# Patient Record
Sex: Female | Born: 1995
Health system: Southern US, Community
[De-identification: ages and names within clinical notes are randomized; demographics above are authoritative.]

---

## 2018-01-27 ENCOUNTER — Emergency Department (HOSPITAL_COMMUNITY)
Admission: EM | Admit: 2018-01-27 | Discharge: 2018-01-28 | Disposition: A | Discharge status: Home / Self Care | Payer: PRIVATE HEALTH INSURANCE | Attending: Emergency Medicine | Admitting: Emergency Medicine

## 2018-01-27 DIAGNOSIS — K130 Diseases of lips: Secondary | ICD-10-CM

## 2018-01-27 DIAGNOSIS — K1370 Unspecified lesions of oral mucosa: Secondary | ICD-10-CM | POA: Diagnosis not present

## 2018-01-27 DIAGNOSIS — R21 Rash and other nonspecific skin eruption: Secondary | ICD-10-CM | POA: Diagnosis present

## 2018-01-28 ENCOUNTER — Encounter (HOSPITAL_COMMUNITY): Payer: Self-pay | Admitting: Emergency Medicine

## 2018-01-28 NOTE — ED Triage Notes (Signed)
Pt c/o red bumps on and around lower lip onset today, pt denies having an allergy to anything. Pt denies tongue swelling or airway involvement.

## 2018-01-28 NOTE — ED Provider Notes (Signed)
[facility] EMERGENCY DEPARTMENT Provider Note   CSN: [id number] Arrival date & time: 01/27/18  2341     History   Chief Complaint Chief Complaint  Patient presents with  . Rash    HPI Tami Brown is a 22 y.o. female here for evaluation of non tender red dots to inner lips noticed tonight after eating dinner. No other associated symptoms. No h/o same. She has cajun shrimp and pasta for dinner. No known allergies. No radiation or spread of bumps anywhere else on body.   HPI  History reviewed. No pertinent past medical history.  There are no active problems to display for this patient.   History reviewed. No pertinent surgical history.   OB History   None      Home Medications    Prior to Admission medications   Not on File    Family History No family history on file.  Social History Social History   Tobacco Use  . Smoking status: Never Smoker  . Smokeless tobacco: Never Used  Substance Use Topics  . Alcohol use: Yes  . Drug use: Never     Allergies   Patient has no known allergies.   Review of Systems Review of Systems  Skin: Positive for rash.  All other systems reviewed and are negative.    Physical Exam Updated Vital Signs BP 137/86 (BP Location: Right Arm)   Pulse 70   Temp 99.3 F (37.4 C) (Oral)   Resp 16   Ht  (1.727 m)   Wt 61.2 kg (135 lb)   LMP 12/31/2017   SpO2 100%   BMI 20.53 kg/m   Physical Exam  Constitutional: She is oriented to person, place, and time. She appears well-developed and well-nourished. No distress.  NAD.  HENT:  Head: Normocephalic and atraumatic.  Right Ear: External ear normal.  Left Ear: External ear normal.  Nose: Nose normal.  3-4 non tender beefy red dots to upper lip, non blanchable. No lesions to oropharynx. See picture.   Eyes: Conjunctivae and EOM are normal. No scleral icterus.  Neck: Normal range of motion. Neck supple.  Cardiovascular: Normal rate, regular  rhythm and normal heart sounds.  No murmur heard. Pulmonary/Chest: Effort normal and breath sounds normal. She has no wheezes.  Musculoskeletal: Normal range of motion. She exhibits no deformity.  Neurological: She is alert and oriented to person, place, and time.  Skin: Skin is warm and dry. Capillary refill takes less than 2 seconds.  trunk, abdomen, back, upper and lower extremities without petichiae or other rash.   Psychiatric: She has a normal mood and affect. Her behavior is normal. Judgment and thought content normal.  Nursing note and vitals reviewed.      ED Treatments / Results  Labs (all labs ordered are listed, but only abnormal results are displayed) Labs Reviewed - No data to display  EKG None  Radiology No results found.  Procedures Procedures (including critical care time)  Medications Ordered in ED Medications - No data to display   Initial Impression / Assessment and Plan / ED Course  I have reviewed the triage vital signs and the nursing notes.  Pertinent labs & imaging results that were available during my care of the patient were reviewed by me and considered in my medical decision making (see chart for details).    22 year old with non-blanchable, nontender nontraumatic lesions to left mucosa.  Unclear etiology.  She does wear braces.  Most likely cause superficial irritation  or microtrauma.  She denies any direct injury.  No vesicular lesions or pain to suggest herpes labialis.  No associated angioedema, shortness of breath, throat swelling or any other allergic type reactions.  she has no petechiae, known history of bleeding or clotting disorders.  Reassured patient.  I do not think emergent lab work or imaging is indicated today.  She has no other signs or symptoms of bleeding.  Will discharge with return precautions.  She is aware of symptoms that would warrant return to the ER.  If these lesions multiply or she develops purpura she is aware she needs  to follow-up with hematology/oncology.  Final Clinical Impressions(s) / ED Diagnoses   Final diagnoses:  Lip lesion    ED Discharge Orders    None       Jerrell Mylar 01/28/18 0520    Azalia Bilis, MD 01/28/18 [phone]

## 2018-01-28 NOTE — Discharge Instructions (Signed)
The cause of the lesions is unclear. We discussed some possibilities.   Monitor the lesions, if they persist but do not bother you no further intervention is needed. If they spread or you develop new lesions on your skin, follow up with hematology.   Return for facial tongue throat swelling, difficulty breathing, shortness of breath, fevers.

## 2018-04-04 ENCOUNTER — Other Ambulatory Visit: Payer: Self-pay

## 2018-04-04 ENCOUNTER — Encounter (HOSPITAL_COMMUNITY): Payer: Self-pay | Admitting: *Deleted

## 2018-04-04 ENCOUNTER — Ambulatory Visit (HOSPITAL_COMMUNITY)
Admission: EM | Admit: 2018-04-04 | Discharge: 2018-04-04 | Disposition: A | Discharge status: Home / Self Care | Payer: PRIVATE HEALTH INSURANCE | Attending: Family Medicine | Admitting: Family Medicine

## 2018-04-04 DIAGNOSIS — N760 Acute vaginitis: Secondary | ICD-10-CM

## 2018-04-04 DIAGNOSIS — N898 Other specified noninflammatory disorders of vagina: Secondary | ICD-10-CM | POA: Diagnosis present

## 2018-04-04 DIAGNOSIS — Z711 Person with feared health complaint in whom no diagnosis is made: Secondary | ICD-10-CM

## 2018-04-04 DIAGNOSIS — B9689 Other specified bacterial agents as the cause of diseases classified elsewhere: Secondary | ICD-10-CM | POA: Diagnosis not present

## 2018-04-04 DIAGNOSIS — Z7251 High risk heterosexual behavior: Secondary | ICD-10-CM

## 2018-04-04 LAB — POCT PREGNANCY, URINE: Preg Test, Ur: NEGATIVE

## 2018-04-04 MED ORDER — CEFTRIAXONE SODIUM 250 MG IJ SOLR
INTRAMUSCULAR | Status: AC
  Filled 2018-04-04: qty 250

## 2018-04-04 MED ORDER — LEVONORGESTREL 1.5 MG PO TABS: 1.5000 mg | ORAL_TABLET | Freq: Once | ORAL | 0 refills | Status: AC

## 2018-04-04 MED ORDER — METRONIDAZOLE 500 MG PO TABS: 500.0000 mg | ORAL_TABLET | Freq: Two times a day (BID) | ORAL | 0 refills | Status: AC

## 2018-04-04 MED ORDER — AZITHROMYCIN 250 MG PO TABS
1000.0000 mg | ORAL_TABLET | Freq: Once | ORAL | Status: AC
  Administered 2018-04-04: 1000 mg via ORAL

## 2018-04-04 MED ORDER — CEFTRIAXONE SODIUM 250 MG IJ SOLR
250.0000 mg | Freq: Once | INTRAMUSCULAR | Status: AC
  Administered 2018-04-04: 250 mg via INTRAMUSCULAR

## 2018-04-04 MED ORDER — LIDOCAINE HCL (PF) 1 % IJ SOLN
INTRAMUSCULAR | Status: AC
  Filled 2018-04-04: qty 2

## 2018-04-04 MED ORDER — AZITHROMYCIN 250 MG PO TABS
ORAL_TABLET | ORAL | Status: AC
  Filled 2018-04-04: qty 4

## 2018-04-04 NOTE — ED Triage Notes (Signed)
C/o vaginal discharge yellow in color and odor, has been having unprotected sex.

## 2018-04-04 NOTE — Discharge Instructions (Addendum)
We treated you today for gonorrhea and chlamydia.  Will notify you of any positive findings and if any changes to treatment are needed.   You will need to notify your partner if any std results return positive. We will start treatment for BV as well. Do not drink alcohol while taking this medication.  Please withhold from intercourse for the next week. Please use condoms to prevent STD's.   Plan b has been sent to the pharmacy as well. This may cause irregular bleeding. Please establish with a primary care provider or gynecologist for management of your birth control.

## 2018-04-04 NOTE — ED Provider Notes (Signed)
[facility]    CSN: [id number] Arrival date & time: 04/04/18  1154     History   Chief Complaint Chief Complaint  Patient presents with  . Vaginal Discharge    HPI Tami Brown is a 22 y.o. female.   [name] presents with complaints of vaginal discharge and odor which started out a week ago. Had some itching originally. Took a left over yeast pill she had. Itching improved but still with odor and yellow discharge. No back pain, no abdominal pain, no fevers. No urinary symptoms. No lesions or sores. No vaginal bleeding. LMP was in may, had been on depo. Was due for depo July 24. Had unprotected sex 7/30, requesting plan b. Has one female partner, does not use condoms. No specific known std exposure but she is uncertain.  Does not have a local PCP. Without contributing medical history.      ROS per HPI.      History reviewed. No pertinent past medical history.  There are no active problems to display for this patient.   History reviewed. No pertinent surgical history.  OB History   None      Home Medications    Prior to Admission medications   Medication Sig Start Date End Date Taking? Authorizing Provider  levonorgestrel (PLAN B 1-STEP) 1.5 MG tablet Take 1 tablet (1.5 mg total) by mouth once for 1 dose. 04/04/18 04/04/18  Georgetta Haber, NP  metroNIDAZOLE (FLAGYL) 500 MG tablet Take 1 tablet (500 mg total) by mouth 2 (two) times daily for 7 days. 04/04/18 04/11/18  Georgetta Haber, NP    Family History No family history on file.  Social History Social History   Tobacco Use  . Smoking status: Never Smoker  . Smokeless tobacco: Never Used  Substance Use Topics  . Alcohol use: Yes  . Drug use: Never     Allergies   Patient has no known allergies.   Review of Systems Review of Systems   Physical Exam Triage Vital Signs ED Triage Vitals  Enc Vitals Group     BP 04/04/18 1219 (!) 142/93     Pulse Rate 04/04/18 1219 69     Resp 04/04/18 1219  16     Temp 04/04/18 1219 98.9 F (37.2 C)     Temp Source 04/04/18 1219 Oral     SpO2 04/04/18 1219 100 %     Weight --      Height --      Head Circumference --      Peak Flow --      Pain Score 04/04/18 1221 0     Pain Loc --      Pain Edu? --      Excl. in GC? --    No data found.  Updated Vital Signs BP (!) 142/93 (BP Location: Left Arm)   Pulse 69   Temp 98.9 F (37.2 C) (Oral)   Resp 16   LMP 01/02/2018   SpO2 100%   Visual Acuity Right Eye Distance:   Left Eye Distance:   Bilateral Distance:    Right Eye Near:   Left Eye Near:    Bilateral Near:     Physical Exam  Constitutional: She is oriented to person, place, and time. She appears well-developed and well-nourished. No distress.  Cardiovascular: Normal rate, regular rhythm and normal heart sounds.  Pulmonary/Chest: Effort normal and breath sounds normal.  Abdominal: Soft. Bowel sounds are normal. She exhibits no distension. There is  no tenderness. There is no rigidity, no rebound, no guarding, no CVA tenderness and negative Murphy's sign.  Genitourinary:  Genitourinary Comments: Denies bleeding, sores or lesions; self vaginal swab collected   Neurological: She is alert and oriented to person, place, and time.  Skin: Skin is warm and dry.     UC Treatments / Results  Labs (all labs ordered are listed, but only abnormal results are displayed) Labs Reviewed  CERVICOVAGINAL ANCILLARY ONLY    EKG None  Radiology No results found.  Procedures Procedures (including critical care time)  Medications Ordered in UC Medications  azithromycin (ZITHROMAX) tablet 1,000 mg (has no administration in time range)  cefTRIAXone (ROCEPHIN) injection 250 mg (has no administration in time range)    Initial Impression / Assessment and Plan / UC Course  I have reviewed the triage vital signs and the nursing notes.  Pertinent labs & imaging results that were available during my care of the patient were reviewed  by me and considered in my medical decision making (see chart for details).     Patient agreeable to empiric rocephin and azithromycin. Coverage with flagyl as well. Will notify of any positive from vaginal cytology and if any changes to treatment are needed.   Plan b provided. Encouraged safe sex practices. Encouraged establish with PCP/gyne for depo management and follow up. Patient verbalized understanding and agreeable to plan.    Final Clinical Impressions(s) / UC Diagnoses   Final diagnoses:  Acute vaginitis  Concern about STD in female without diagnosis  Unprotected sex     Discharge Instructions     We treated you today for gonorrhea and chlamydia.  Will notify you of any positive findings and if any changes to treatment are needed.   You will need to notify your partner if any std results return positive. We will start treatment for BV as well. Do not drink alcohol while taking this medication.  Please withhold from intercourse for the next week. Please use condoms to prevent STD's.   Plan b has been sent to the pharmacy as well. This may cause irregular bleeding. Please establish with a primary care provider or gynecologist for management of your birth control.    ED Prescriptions    Medication Sig Dispense Auth. Provider   metroNIDAZOLE (FLAGYL) 500 MG tablet Take 1 tablet (500 mg total) by mouth 2 (two) times daily for 7 days. 14 tablet Linus MakoBurky, Natalie B, NP   levonorgestrel (PLAN B 1-STEP) 1.5 MG tablet Take 1 tablet (1.5 mg total) by mouth once for 1 dose. 1 tablet Georgetta HaberBurky, Natalie B, NP     Controlled Substance Prescriptions [place] Controlled Substance Registry consulted? Not Applicable   Georgetta HaberBurky, Natalie B, NP 04/04/18 1253

## 2018-04-05 ENCOUNTER — Telehealth (HOSPITAL_COMMUNITY): Payer: Self-pay

## 2018-04-05 LAB — CERVICOVAGINAL ANCILLARY ONLY
Bacterial vaginitis: POSITIVE — AB
CANDIDA VAGINITIS: NEGATIVE
CHLAMYDIA, DNA PROBE: NEGATIVE
Neisseria Gonorrhea: NEGATIVE
TRICH (WINDOWPATH): NEGATIVE

## 2019-10-21 ENCOUNTER — Encounter (HOSPITAL_COMMUNITY): Payer: Self-pay

## 2019-10-21 ENCOUNTER — Other Ambulatory Visit: Payer: Self-pay

## 2019-10-21 ENCOUNTER — Ambulatory Visit (HOSPITAL_COMMUNITY)
Admission: EM | Admit: 2019-10-21 | Discharge: 2019-10-21 | Disposition: A | Discharge status: Home / Self Care | Payer: PRIVATE HEALTH INSURANCE | Attending: Family Medicine | Admitting: Family Medicine

## 2019-10-21 DIAGNOSIS — N898 Other specified noninflammatory disorders of vagina: Secondary | ICD-10-CM | POA: Diagnosis not present

## 2019-10-21 DIAGNOSIS — Z3202 Encounter for pregnancy test, result negative: Secondary | ICD-10-CM | POA: Diagnosis not present

## 2019-10-21 LAB — POCT PREGNANCY, URINE: Preg Test, Ur: NEGATIVE

## 2019-10-21 LAB — POC URINE PREG, ED: Preg Test, Ur: NEGATIVE

## 2019-10-21 MED ORDER — METRONIDAZOLE 500 MG PO TABS: 500.0000 mg | ORAL_TABLET | Freq: Two times a day (BID) | ORAL | 0 refills | Status: AC

## 2019-10-21 NOTE — Discharge Instructions (Addendum)
Begin metronidazole twice daily for 1 week. No alcohol. Take with food.  We are testing you for Gonorrhea, Chlamydia, Trichomonas, Yeast and Bacterial Vaginosis. We will call you if anything is positive and let you know if you require any further treatment. Please inform partners of any positive results.   Please return if symptoms not improving with treatment, development of fever, nausea, vomiting, abdominal pain.

## 2019-10-21 NOTE — ED Triage Notes (Signed)
Pt state she has a vaginal discharge x 1 week. Pt states she wants STD testing .

## 2019-10-22 NOTE — ED Provider Notes (Signed)
[facility]    CSN: [id number] Arrival date & time: 10/21/19  1846      History   Chief Complaint Chief Complaint  Patient presents with  . SEXUALLY TRANSMITTED DISEASE    HPI Tami Brown is a 24 y.o. female no significant past medical history presenting today for evaluation of vaginal discharge.  Patient states that the past week she has had vaginal discharge.  Describes discharge as white and creamy.  She denies associated itch or irritation.  She also has noticed slight odor associated with the discharge.  She denies any urinary symptoms of dysuria, increased frequency or urgency.  Denies hematuria.  Denies any abdominal pain or pelvic pain.  Denies fevers nausea or vomiting.  Does not feel similar to prior yeast infections.  Does not have significant history of BV, but believes she has had this once prior.  Patient is sexually active, would like to be screened for STDs.  Last menstrual cycle was 2/5.  Not on any form of birth control.  HPI  History reviewed. No pertinent past medical history.  There are no problems to display for this patient.   History reviewed. No pertinent surgical history.  OB History   No obstetric history on file.      Home Medications    Prior to Admission medications   Medication Sig Start Date End Date Taking? Authorizing Provider  metroNIDAZOLE (FLAGYL) 500 MG tablet Take 1 tablet (500 mg total) by mouth 2 (two) times daily for 7 days. 10/21/19 10/28/19  Preslei Blakley, Junius Creamer, PA-C    Family History History reviewed. No pertinent family history.  Social History Social History   Tobacco Use  . Smoking status: Never Smoker  . Smokeless tobacco: Never Used  Substance Use Topics  . Alcohol use: Yes  . Drug use: Never     Allergies   Patient has no known allergies.   Review of Systems Review of Systems  Constitutional: Negative for fever.  Respiratory: Negative for shortness of breath.   Cardiovascular: Negative for  chest pain.  Gastrointestinal: Negative for abdominal pain, diarrhea, nausea and vomiting.  Genitourinary: Positive for vaginal discharge. Negative for dysuria, flank pain, genital sores, hematuria, menstrual problem, vaginal bleeding and vaginal pain.  Musculoskeletal: Negative for back pain.  Skin: Negative for rash.  Neurological: Negative for dizziness, light-headedness and headaches.     Physical Exam Triage Vital Signs ED Triage Vitals  Enc Vitals Group     BP 10/21/19 1958 106/90     Pulse Rate 10/21/19 1958 63     Resp 10/21/19 1958 16     Temp 10/21/19 1958 98.5 F (36.9 C)     Temp Source 10/21/19 1958 Oral     SpO2 10/21/19 1958 100 %     Weight 10/21/19 2000 145 lb (65.8 kg)     Height --      Head Circumference --      Peak Flow --      Pain Score 10/21/19 2000 0     Pain Loc --      Pain Edu? --      Excl. in GC? --    No data found.  Updated Vital Signs BP 106/90 (BP Location: Right Arm)   Pulse 63   Temp 98.5 F (36.9 C) (Oral)   Resp 16   Wt 145 lb (65.8 kg)   LMP 10/10/2019   SpO2 100%   BMI 22.05 kg/m   Visual Acuity Right Eye Distance:  Left Eye Distance:   Bilateral Distance:    Right Eye Near:   Left Eye Near:    Bilateral Near:     Physical Exam Vitals and nursing note reviewed.  Constitutional:      Appearance: She is well-developed.     Comments: No acute distress  HENT:     Head: Normocephalic and atraumatic.     Nose: Nose normal.  Eyes:     Conjunctiva/sclera: Conjunctivae normal.  Cardiovascular:     Rate and Rhythm: Normal rate.  Pulmonary:     Effort: Pulmonary effort is normal. No respiratory distress.  Abdominal:     General: There is no distension.     Comments: Soft, nondistended, nontender light deep palpation throughout abdomen  Musculoskeletal:        General: Normal range of motion.     Cervical back: Neck supple.  Skin:    General: Skin is warm and dry.  Neurological:     Mental Status: She is alert  and oriented to person, place, and time.      UC Treatments / Results  Labs (all labs ordered are listed, but only abnormal results are displayed) Labs Reviewed  POC URINE PREG, ED  POCT PREGNANCY, URINE  CERVICOVAGINAL ANCILLARY ONLY    EKG   Radiology No results found.  Procedures Procedures (including critical care time)  Medications Ordered in UC Medications - No data to display  Initial Impression / Assessment and Plan / UC Course  I have reviewed the triage vital signs and the nursing notes.  Pertinent labs & imaging results that were available during my care of the patient were reviewed by me and considered in my medical decision making (see chart for details).     Pregnancy test negative.  Vaginal swab pending.  Will call with results and alter treatment as needed.  Will empirically treat for BV today with Flagyl twice daily x1 week.  Discussed strict return precautions. Patient verbalized understanding and is agreeable with plan.  Final Clinical Impressions(s) / UC Diagnoses   Final diagnoses:  Vaginal discharge     Discharge Instructions     Begin metronidazole twice daily for 1 week. No alcohol. Take with food.  We are testing you for Gonorrhea, Chlamydia, Trichomonas, Yeast and Bacterial Vaginosis. We will call you if anything is positive and let you know if you require any further treatment. Please inform partners of any positive results.   Please return if symptoms not improving with treatment, development of fever, nausea, vomiting, abdominal pain.    ED Prescriptions    Medication Sig Dispense Auth. Provider   metroNIDAZOLE (FLAGYL) 500 MG tablet Take 1 tablet (500 mg total) by mouth 2 (two) times daily for 7 days. 14 tablet Kennia Vanvorst, Wales C, PA-C     PDMP not reviewed this encounter.   Kattleya Kuhnert, Ida C, PA-C 10/22/19 1057

## 2019-10-23 LAB — CERVICOVAGINAL ANCILLARY ONLY
Bacterial vaginitis: POSITIVE — AB
Candida vaginitis: POSITIVE — AB
Chlamydia: NEGATIVE
Neisseria Gonorrhea: NEGATIVE
Trichomonas: NEGATIVE

## 2019-10-27 ENCOUNTER — Telehealth (HOSPITAL_COMMUNITY): Payer: Self-pay | Admitting: Emergency Medicine

## 2019-10-27 MED ORDER — FLUCONAZOLE 150 MG PO TABS: 150.0000 mg | ORAL_TABLET | Freq: Once | ORAL | 0 refills | Status: AC

## 2019-10-27 NOTE — Telephone Encounter (Signed)
Bacterial Vaginosis test is positive.  Prescription for metronidazole was given at the urgent care visit.  Test for candida (yeast) was positive.  Prescription for fluconazole 150mg po now, repeat dose in 3d if needed, #2 no refills, sent to the pharmacy of record.  Recheck or followup with PCP for further evaluation if symptoms are not improving.    Patient contacted by phone and made aware of    results. Pt verbalized understanding and had all questions answered.   

## 2020-06-25 ENCOUNTER — Encounter (HOSPITAL_COMMUNITY): Payer: Self-pay

## 2020-06-25 ENCOUNTER — Inpatient Hospital Stay (HOSPITAL_COMMUNITY): Payer: Medicaid - Out of State

## 2020-06-25 ENCOUNTER — Emergency Department (HOSPITAL_COMMUNITY): Payer: Medicaid - Out of State

## 2020-06-25 ENCOUNTER — Inpatient Hospital Stay (HOSPITAL_COMMUNITY)
Admission: EM | Admit: 2020-06-25 | Discharge: 2020-09-03 | DRG: 003 | Disposition: A | Discharge status: Rehabilitation Facility | Payer: Medicaid - Out of State | Attending: Surgery | Admitting: Surgery

## 2020-06-25 DIAGNOSIS — S129XXA Fracture of neck, unspecified, initial encounter: Secondary | ICD-10-CM | POA: Diagnosis present

## 2020-06-25 DIAGNOSIS — S12390A Other displaced fracture of fourth cervical vertebra, initial encounter for closed fracture: Secondary | ICD-10-CM | POA: Diagnosis present

## 2020-06-25 DIAGNOSIS — F1010 Alcohol abuse, uncomplicated: Secondary | ICD-10-CM

## 2020-06-25 DIAGNOSIS — Z4659 Encounter for fitting and adjustment of other gastrointestinal appliance and device: Secondary | ICD-10-CM

## 2020-06-25 DIAGNOSIS — S022XXA Fracture of nasal bones, initial encounter for closed fracture: Secondary | ICD-10-CM | POA: Diagnosis present

## 2020-06-25 DIAGNOSIS — R491 Aphonia: Secondary | ICD-10-CM | POA: Diagnosis not present

## 2020-06-25 DIAGNOSIS — T1490XA Injury, unspecified, initial encounter: Secondary | ICD-10-CM | POA: Diagnosis present

## 2020-06-25 DIAGNOSIS — R402112 Coma scale, eyes open, never, at arrival to emergency department: Secondary | ICD-10-CM | POA: Diagnosis present

## 2020-06-25 DIAGNOSIS — R402312 Coma scale, best motor response, none, at arrival to emergency department: Secondary | ICD-10-CM | POA: Diagnosis present

## 2020-06-25 DIAGNOSIS — Z978 Presence of other specified devices: Secondary | ICD-10-CM

## 2020-06-25 DIAGNOSIS — S22048A Other fracture of fourth thoracic vertebra, initial encounter for closed fracture: Secondary | ICD-10-CM | POA: Diagnosis present

## 2020-06-25 DIAGNOSIS — S12090A Other displaced fracture of first cervical vertebra, initial encounter for closed fracture: Secondary | ICD-10-CM | POA: Diagnosis present

## 2020-06-25 DIAGNOSIS — G8918 Other acute postprocedural pain: Secondary | ICD-10-CM

## 2020-06-25 DIAGNOSIS — J9601 Acute respiratory failure with hypoxia: Secondary | ICD-10-CM | POA: Diagnosis present

## 2020-06-25 DIAGNOSIS — J1520 Pneumonia due to staphylococcus, unspecified: Secondary | ICD-10-CM | POA: Diagnosis not present

## 2020-06-25 DIAGNOSIS — R030 Elevated blood-pressure reading, without diagnosis of hypertension: Secondary | ICD-10-CM | POA: Diagnosis not present

## 2020-06-25 DIAGNOSIS — R1110 Vomiting, unspecified: Secondary | ICD-10-CM

## 2020-06-25 DIAGNOSIS — S0285XA Fracture of orbit, unspecified, initial encounter for closed fracture: Secondary | ICD-10-CM | POA: Diagnosis present

## 2020-06-25 DIAGNOSIS — Z419 Encounter for procedure for purposes other than remedying health state, unspecified: Secondary | ICD-10-CM

## 2020-06-25 DIAGNOSIS — R739 Hyperglycemia, unspecified: Secondary | ICD-10-CM | POA: Diagnosis not present

## 2020-06-25 DIAGNOSIS — R402212 Coma scale, best verbal response, none, at arrival to emergency department: Secondary | ICD-10-CM | POA: Diagnosis present

## 2020-06-25 DIAGNOSIS — R1310 Dysphagia, unspecified: Secondary | ICD-10-CM | POA: Diagnosis present

## 2020-06-25 DIAGNOSIS — S22038A Other fracture of third thoracic vertebra, initial encounter for closed fracture: Secondary | ICD-10-CM | POA: Diagnosis present

## 2020-06-25 DIAGNOSIS — K5900 Constipation, unspecified: Secondary | ICD-10-CM | POA: Diagnosis not present

## 2020-06-25 DIAGNOSIS — S0240DA Maxillary fracture, left side, initial encounter for closed fracture: Secondary | ICD-10-CM | POA: Diagnosis present

## 2020-06-25 DIAGNOSIS — S02113A Unspecified occipital condyle fracture, initial encounter for closed fracture: Secondary | ICD-10-CM | POA: Diagnosis present

## 2020-06-25 DIAGNOSIS — Z931 Gastrostomy status: Secondary | ICD-10-CM | POA: Diagnosis not present

## 2020-06-25 DIAGNOSIS — S066X9A Traumatic subarachnoid hemorrhage with loss of consciousness of unspecified duration, initial encounter: Secondary | ICD-10-CM | POA: Diagnosis present

## 2020-06-25 DIAGNOSIS — S069X3D Unspecified intracranial injury with loss of consciousness of 1 hour to 5 hours 59 minutes, subsequent encounter: Secondary | ICD-10-CM | POA: Diagnosis not present

## 2020-06-25 DIAGNOSIS — S27322A Contusion of lung, bilateral, initial encounter: Secondary | ICD-10-CM | POA: Diagnosis present

## 2020-06-25 DIAGNOSIS — Z20822 Contact with and (suspected) exposure to covid-19: Secondary | ICD-10-CM | POA: Diagnosis present

## 2020-06-25 DIAGNOSIS — R000 Tachycardia, unspecified: Secondary | ICD-10-CM

## 2020-06-25 DIAGNOSIS — Z23 Encounter for immunization: Secondary | ICD-10-CM | POA: Diagnosis not present

## 2020-06-25 DIAGNOSIS — J9690 Respiratory failure, unspecified, unspecified whether with hypoxia or hypercapnia: Secondary | ICD-10-CM | POA: Diagnosis present

## 2020-06-25 DIAGNOSIS — S0269XA Fracture of mandible of other specified site, initial encounter for closed fracture: Secondary | ICD-10-CM | POA: Diagnosis present

## 2020-06-25 DIAGNOSIS — E875 Hyperkalemia: Secondary | ICD-10-CM | POA: Diagnosis not present

## 2020-06-25 DIAGNOSIS — Y92410 Unspecified street and highway as the place of occurrence of the external cause: Secondary | ICD-10-CM | POA: Diagnosis not present

## 2020-06-25 DIAGNOSIS — L8990 Pressure ulcer of unspecified site, unspecified stage: Secondary | ICD-10-CM | POA: Insufficient documentation

## 2020-06-25 DIAGNOSIS — G479 Sleep disorder, unspecified: Secondary | ICD-10-CM | POA: Diagnosis not present

## 2020-06-25 DIAGNOSIS — Q899 Congenital malformation, unspecified: Secondary | ICD-10-CM

## 2020-06-25 DIAGNOSIS — S069X9A Unspecified intracranial injury with loss of consciousness of unspecified duration, initial encounter: Secondary | ICD-10-CM

## 2020-06-25 DIAGNOSIS — S069X3S Unspecified intracranial injury with loss of consciousness of 1 hour to 5 hours 59 minutes, sequela: Secondary | ICD-10-CM | POA: Diagnosis not present

## 2020-06-25 DIAGNOSIS — K9423 Gastrostomy malfunction: Secondary | ICD-10-CM

## 2020-06-25 DIAGNOSIS — R1312 Dysphagia, oropharyngeal phase: Secondary | ICD-10-CM | POA: Diagnosis not present

## 2020-06-25 DIAGNOSIS — S069X9D Unspecified intracranial injury with loss of consciousness of unspecified duration, subsequent encounter: Secondary | ICD-10-CM | POA: Diagnosis not present

## 2020-06-25 LAB — COMPREHENSIVE METABOLIC PANEL
ALT: 32 U/L (ref 0–44)
AST: 71 U/L — ABNORMAL HIGH (ref 15–41)
Albumin: 4.1 g/dL (ref 3.5–5.0)
Alkaline Phosphatase: 55 U/L (ref 38–126)
Anion gap: 15 (ref 5–15)
BUN: 8 mg/dL (ref 6–20)
CO2: 18 mmol/L — ABNORMAL LOW (ref 22–32)
Calcium: 9 mg/dL (ref 8.9–10.3)
Chloride: 107 mmol/L (ref 98–111)
Creatinine, Ser: 0.9 mg/dL (ref 0.44–1.00)
GFR, Estimated: 43 mL/min — ABNORMAL LOW (ref >60)
Glucose, Bld: 130 mg/dL — ABNORMAL HIGH (ref 70–99)
Potassium: 3.4 mmol/L — ABNORMAL LOW (ref 3.5–5.1)
Sodium: 140 mmol/L (ref 135–145)
Total Bilirubin: 0.4 mg/dL (ref 0.3–1.2)
Total Protein: 7.4 g/dL (ref 6.5–8.1)

## 2020-06-25 LAB — URINALYSIS, ROUTINE W REFLEX MICROSCOPIC
Bacteria, UA: NONE SEEN
Bilirubin Urine: NEGATIVE
Glucose, UA: NEGATIVE mg/dL
Ketones, ur: NEGATIVE mg/dL
Leukocytes,Ua: NEGATIVE
Nitrite: NEGATIVE
Protein, ur: NEGATIVE mg/dL
Specific Gravity, Urine: 1.023 (ref 1.005–1.030)
pH: 6 (ref 5.0–8.0)

## 2020-06-25 LAB — LACTIC ACID, PLASMA
Lactic Acid, Venous: 2.3 mmol/L (ref 0.5–1.9)
Lactic Acid, Venous: 4.3 mmol/L (ref 0.5–1.9)

## 2020-06-25 LAB — RESPIRATORY PANEL BY RT PCR (FLU A&B, COVID)
Influenza A by PCR: NEGATIVE
Influenza B by PCR: NEGATIVE
SARS Coronavirus 2 by RT PCR: NEGATIVE

## 2020-06-25 LAB — I-STAT ARTERIAL BLOOD GAS, ED
Acid-base deficit: 5 mmol/L — ABNORMAL HIGH (ref 0.0–2.0)
Bicarbonate: 20.9 mmol/L (ref 20.0–28.0)
Calcium, Ion: 1.13 mmol/L — ABNORMAL LOW (ref 1.15–1.40)
HCT: 43 % (ref 36.0–46.0)
Hemoglobin: 14.6 g/dL (ref 12.0–15.0)
O2 Saturation: 100 %
Patient temperature: 96.4
Potassium: 3.8 mmol/L (ref 3.5–5.1)
Sodium: 143 mmol/L (ref 135–145)
TCO2: 22 mmol/L (ref 22–32)
pCO2 arterial: 37.9 mmHg (ref 32.0–48.0)
pH, Arterial: 7.342 — ABNORMAL LOW (ref 7.350–7.450)
pO2, Arterial: 243 mmHg — ABNORMAL HIGH (ref 83.0–108.0)

## 2020-06-25 LAB — I-STAT CHEM 8, ED
BUN: 8 mg/dL (ref 6–20)
Calcium, Ion: 1.16 mmol/L (ref 1.15–1.40)
Chloride: 108 mmol/L (ref 98–111)
Creatinine, Ser: 1.1 mg/dL — ABNORMAL HIGH (ref 0.44–1.00)
Glucose, Bld: 130 mg/dL — ABNORMAL HIGH (ref 70–99)
HCT: 45 % (ref 36.0–46.0)
Hemoglobin: 15.3 g/dL — ABNORMAL HIGH (ref 12.0–15.0)
Potassium: 3.4 mmol/L — ABNORMAL LOW (ref 3.5–5.1)
Sodium: 143 mmol/L (ref 135–145)
TCO2: 21 mmol/L — ABNORMAL LOW (ref 22–32)

## 2020-06-25 LAB — CBC
HCT: 43.8 % (ref 36.0–46.0)
Hemoglobin: 14.7 g/dL (ref 12.0–15.0)
MCH: 32.1 pg (ref 26.0–34.0)
MCHC: 33.6 g/dL (ref 30.0–36.0)
MCV: 95.6 fL (ref 80.0–100.0)
Platelets: 314 10*3/uL (ref 150–400)
RBC: 4.58 MIL/uL (ref 3.87–5.11)
RDW: 12 % (ref 11.5–15.5)
WBC: 15.9 10*3/uL — ABNORMAL HIGH (ref 4.0–10.5)
nRBC: 0 % (ref 0.0–0.2)

## 2020-06-25 LAB — SAMPLE TO BLOOD BANK

## 2020-06-25 LAB — PROTIME-INR
INR: 1 (ref 0.8–1.2)
Prothrombin Time: 13.2 seconds (ref 11.4–15.2)

## 2020-06-25 LAB — HIV ANTIBODY (ROUTINE TESTING W REFLEX): HIV Screen 4th Generation wRfx: NONREACTIVE

## 2020-06-25 LAB — ETHANOL: Alcohol, Ethyl (B): 263 mg/dL — ABNORMAL HIGH (ref <10)

## 2020-06-25 MED ORDER — SODIUM CHLORIDE 0.9 % IV SOLN
INTRAVENOUS | Status: AC | PRN
  Administered 2020-06-25: 1000 mL via INTRAVENOUS

## 2020-06-25 MED ORDER — IOHEXOL 300 MG/ML  SOLN
100.0000 mL | Freq: Once | INTRAMUSCULAR | Status: AC | PRN
  Administered 2020-06-25: 100 mL via INTRAVENOUS

## 2020-06-25 MED ORDER — THIAMINE HCL 100 MG/ML IJ SOLN
100.0000 mg | Freq: Every day | INTRAMUSCULAR | Status: DC
  Administered 2020-06-25 – 2020-06-26 (×2): 100 mg via INTRAVENOUS
  Filled 2020-06-25 (×2): qty 2

## 2020-06-25 MED ORDER — LACTATED RINGERS IV SOLN: INTRAVENOUS | Status: DC

## 2020-06-25 MED ORDER — THIAMINE HCL 100 MG PO TABS
100.0000 mg | ORAL_TABLET | Freq: Every day | ORAL | Status: DC
  Administered 2020-06-27: 100 mg via ORAL
  Filled 2020-06-25 (×2): qty 1

## 2020-06-25 MED ORDER — FOLIC ACID 1 MG PO TABS
1.0000 mg | ORAL_TABLET | Freq: Every day | ORAL | Status: DC
  Administered 2020-06-26 – 2020-09-03 (×69): 1 mg
  Filled 2020-06-25 (×70): qty 1

## 2020-06-25 MED ORDER — ETOMIDATE 2 MG/ML IV SOLN
INTRAVENOUS | Status: AC | PRN
  Administered 2020-06-25: 20 mg via INTRAVENOUS

## 2020-06-25 MED ORDER — SODIUM CHLORIDE 0.9 % IV SOLN
3.0000 g | Freq: Once | INTRAVENOUS | Status: AC
  Administered 2020-06-25: 3 g via INTRAVENOUS
  Filled 2020-06-25: qty 8

## 2020-06-25 MED ORDER — LORAZEPAM 1 MG PO TABS: 1.0000 mg | ORAL_TABLET | ORAL | Status: DC | PRN

## 2020-06-25 MED ORDER — FOLIC ACID 1 MG PO TABS
1.0000 mg | ORAL_TABLET | Freq: Every day | ORAL | Status: DC
  Filled 2020-06-25: qty 1

## 2020-06-25 MED ORDER — ADULT MULTIVITAMIN W/MINERALS CH
1.0000 | ORAL_TABLET | Freq: Every day | ORAL | Status: DC
  Administered 2020-06-26 – 2020-09-03 (×69): 1
  Filled 2020-06-25 (×70): qty 1

## 2020-06-25 MED ORDER — ORAL CARE MOUTH RINSE
15.0000 mL | OROMUCOSAL | Status: DC
  Administered 2020-06-25 – 2020-09-03 (×528): 15 mL via OROMUCOSAL

## 2020-06-25 MED ORDER — LORAZEPAM 2 MG/ML IJ SOLN: 1.0000 mg | INTRAMUSCULAR | Status: DC | PRN

## 2020-06-25 MED ORDER — TETANUS-DIPHTH-ACELL PERTUSSIS 5-2.5-18.5 LF-MCG/0.5 IM SUSP
0.5000 mL | Freq: Once | INTRAMUSCULAR | Status: AC
  Administered 2020-06-25: 0.5 mL via INTRAMUSCULAR
  Filled 2020-06-25: qty 0.5

## 2020-06-25 MED ORDER — CHLORHEXIDINE GLUCONATE 0.12% ORAL RINSE (MEDLINE KIT)
15.0000 mL | Freq: Two times a day (BID) | OROMUCOSAL | Status: DC
  Administered 2020-06-25 – 2020-09-01 (×124): 15 mL via OROMUCOSAL
  Filled 2020-06-25: qty 15

## 2020-06-25 MED ORDER — SUCCINYLCHOLINE CHLORIDE 20 MG/ML IJ SOLN
INTRAMUSCULAR | Status: AC | PRN
  Administered 2020-06-25: 100 mg via INTRAVENOUS

## 2020-06-25 MED ORDER — FENTANYL 2500MCG IN NS 250ML (10MCG/ML) PREMIX INFUSION
0.0000 ug/h | INTRAVENOUS | Status: DC
  Administered 2020-06-25: 25 ug/h via INTRAVENOUS
  Administered 2020-06-26: 75 ug/h via INTRAVENOUS
  Administered 2020-06-28 – 2020-07-02 (×3): 25 ug/h via INTRAVENOUS
  Filled 2020-06-25 (×5): qty 250

## 2020-06-25 MED ORDER — ADULT MULTIVITAMIN W/MINERALS CH
1.0000 | ORAL_TABLET | Freq: Every day | ORAL | Status: DC
  Filled 2020-06-25: qty 1

## 2020-06-25 MED ORDER — CHLORHEXIDINE GLUCONATE CLOTH 2 % EX PADS: 6.0000 | MEDICATED_PAD | Freq: Every day | CUTANEOUS | Status: DC
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
## 2020-06-25 NOTE — ED Notes (Signed)
[place] collar placed on pt 

## 2020-06-25 NOTE — Progress Notes (Signed)
Patient ID: Tami Brown, female   DOB: May 27, 1996, 24 y.o.   MRN: [id number] Patient re-evaluated with Dr. Franky Macho. WD to pain, not F/C. Not able to extubate yet. If MS not improved tomorrow, may need MR brain to evaluate.  Patient examined and I agree with the assessment and plan  Violeta Gelinas, MD, MPH, FACS Please use [email or web address] to contact on call provider 06/25/2020 1:08 PM
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
## 2020-06-25 NOTE — ED Notes (Signed)
Please call father of patient Gaia Gullikson for an update at [phone]

## 2020-06-25 NOTE — Progress Notes (Signed)
All jewelry removed from patient (3 white metal rings, 1 white metal belly piercing, and multiple white metal bracelets) and placed in container with patient label. When patient's sister came to bedside, this jewelry was placed in the patient belongings bag, so she could take home all belongings ( sister stated that some of the belongings were hers, not patient's).
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
## 2020-06-25 NOTE — Progress Notes (Signed)
Pt transported to CT and back without any complications.  

## 2020-06-25 NOTE — Progress Notes (Addendum)
Patient ID: Tami Brown, female   DOB: Jul 18, 1996, 24 y.o.   MRN: [id number] Films reviewed. Hemorrhage in right internal capsule does account for the plegia on her left side and lack of spontaneous movements.

## 2020-06-25 NOTE — ED Provider Notes (Signed)
Procedure Name: Intubation Date/Time: 06/25/2020 1:49 AM Performed by: Abigail Butts, PA-C Pre-anesthesia Checklist: Patient identified, Patient being monitored, Emergency Drugs available, Timeout performed and Suction available Oxygen Delivery Method: Non-rebreather mask Preoxygenation: Pre-oxygenation with 100% oxygen Induction Type: Rapid sequence Ventilation: Mask ventilation without difficulty Laryngoscope Size: Mac and 3 Tube size: 7.5 mm Number of attempts: 1 Airway Equipment and Method: Video-laryngoscopy Placement Confirmation: ETT inserted through vocal cords under direct vision,  CO2 detector and Breath sounds checked- equal and bilateral Secured at: 25 cm Tube secured with: ETT holder Dental Injury: Teeth and Oropharynx as per pre-operative assessment  Comments: Post intubation CXR reviewed        Abigail Butts, PA-C 06/25/20 0150    Orpah Greek, MD 06/25/20 [phone]
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
## 2020-06-26 ENCOUNTER — Inpatient Hospital Stay (HOSPITAL_COMMUNITY): Payer: Medicaid - Out of State

## 2020-06-26 LAB — CBC
HCT: 38.8 % (ref 36.0–46.0)
Hemoglobin: 13.2 g/dL (ref 12.0–15.0)
MCH: 32.4 pg (ref 26.0–34.0)
MCHC: 34 g/dL (ref 30.0–36.0)
MCV: 95.1 fL (ref 80.0–100.0)
Platelets: 251 10*3/uL (ref 150–400)
RBC: 4.08 MIL/uL (ref 3.87–5.11)
RDW: 12.5 % (ref 11.5–15.5)
WBC: 24.3 10*3/uL — ABNORMAL HIGH (ref 4.0–10.5)
nRBC: 0 % (ref 0.0–0.2)

## 2020-06-26 LAB — BASIC METABOLIC PANEL
Anion gap: 16 — ABNORMAL HIGH (ref 5–15)
BUN: 6 mg/dL (ref 6–20)
CO2: 18 mmol/L — ABNORMAL LOW (ref 22–32)
Calcium: 8.7 mg/dL — ABNORMAL LOW (ref 8.9–10.3)
Chloride: 111 mmol/L (ref 98–111)
Creatinine, Ser: 0.97 mg/dL (ref 0.44–1.00)
GFR, Estimated: >60 mL/min (ref >60)
Glucose, Bld: 109 mg/dL — ABNORMAL HIGH (ref 70–99)
Potassium: 3.6 mmol/L (ref 3.5–5.1)
Sodium: 145 mmol/L (ref 135–145)

## 2020-06-26 LAB — MRSA PCR SCREENING: MRSA by PCR: NEGATIVE

## 2020-06-26 MED ORDER — OXYCODONE HCL 5 MG PO TABS
5.0000 mg | ORAL_TABLET | ORAL | Status: DC | PRN
  Administered 2020-06-26 – 2020-07-10 (×31): 10 mg
  Administered 2020-07-10: 5 mg
  Administered 2020-07-10 – 2020-07-15 (×17): 10 mg
  Administered 2020-07-15: 5 mg
  Administered 2020-07-15 – 2020-07-27 (×11): 10 mg
  Administered 2020-07-28 (×3): 5 mg
  Administered 2020-07-29 – 2020-07-30 (×4): 10 mg
  Administered 2020-08-01: 5 mg
  Administered 2020-08-01 – 2020-08-11 (×23): 10 mg
  Administered 2020-08-11 – 2020-08-27 (×2): 5 mg
  Administered 2020-08-28 – 2020-08-29 (×2): 10 mg
  Filled 2020-06-26 (×2): qty 2
  Filled 2020-06-26: qty 1
  Filled 2020-06-26 (×23): qty 2
  Filled 2020-06-26: qty 1
  Filled 2020-06-26 (×2): qty 2
  Filled 2020-06-26: qty 1
  Filled 2020-06-26 (×15): qty 2
  Filled 2020-06-26: qty 1
  Filled 2020-06-26 (×32): qty 2
  Filled 2020-06-26: qty 1
  Filled 2020-06-26 (×4): qty 2
  Filled 2020-06-26: qty 1
  Filled 2020-06-26: qty 2
  Filled 2020-06-26: qty 1
  Filled 2020-06-26 (×12): qty 2

## 2020-06-26 MED ORDER — OXYCODONE HCL 5 MG PO TABS
5.0000 mg | ORAL_TABLET | ORAL | Status: DC | PRN
  Filled 2020-06-26: qty 2

## 2020-06-26 MED ORDER — CHLORHEXIDINE GLUCONATE CLOTH 2 % EX PADS
6.0000 | MEDICATED_PAD | Freq: Every day | CUTANEOUS | Status: DC
  Administered 2020-06-27 – 2020-09-03 (×44): 6 via TOPICAL
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
## 2020-06-26 NOTE — Progress Notes (Signed)
Patient ID: Tami Brown, female   DOB: 10/15/1995, 24 y.o.   MRN: [id number] BP 137/85   Pulse (!) 110   Temp 99.8 F (37.7 C) (Axillary)   Resp 18   Ht 5\' 9"  (1.753 m)   Wt 72.6 kg   SpO2 97%   BMI 23.63 kg/m  Intubated and sedated. Will respond to voice with increased movement on the right . Still with minimal movement on the left.  Collar in place Will need cervical surgery, would prefer to wait until awake, which I anticipate will occur sometime next week.  No change, in exam

## 2020-06-27 ENCOUNTER — Inpatient Hospital Stay (HOSPITAL_COMMUNITY): Payer: Medicaid - Out of State

## 2020-06-27 LAB — CBC
HCT: 36.9 % (ref 36.0–46.0)
Hemoglobin: 12.5 g/dL (ref 12.0–15.0)
MCH: 32.6 pg (ref 26.0–34.0)
MCHC: 33.9 g/dL (ref 30.0–36.0)
MCV: 96.1 fL (ref 80.0–100.0)
Platelets: 245 10*3/uL (ref 150–400)
RBC: 3.84 MIL/uL — ABNORMAL LOW (ref 3.87–5.11)
RDW: 12.6 % (ref 11.5–15.5)
WBC: 19.4 10*3/uL — ABNORMAL HIGH (ref 4.0–10.5)
nRBC: 0 % (ref 0.0–0.2)

## 2020-06-27 LAB — BASIC METABOLIC PANEL
Anion gap: 12 (ref 5–15)
BUN: 6 mg/dL (ref 6–20)
CO2: 22 mmol/L (ref 22–32)
Calcium: 9.1 mg/dL (ref 8.9–10.3)
Chloride: 115 mmol/L — ABNORMAL HIGH (ref 98–111)
Creatinine, Ser: 0.81 mg/dL (ref 0.44–1.00)
GFR, Estimated: >60 mL/min (ref >60)
Glucose, Bld: 122 mg/dL — ABNORMAL HIGH (ref 70–99)
Potassium: 4 mmol/L (ref 3.5–5.1)
Sodium: 149 mmol/L — ABNORMAL HIGH (ref 135–145)

## 2020-06-27 LAB — MAGNESIUM: Magnesium: 2 mg/dL (ref 1.7–2.4)

## 2020-06-27 LAB — PHOSPHORUS: Phosphorus: 2 mg/dL — ABNORMAL LOW (ref 2.5–4.6)

## 2020-06-27 LAB — GLUCOSE, CAPILLARY: Glucose-Capillary: 102 mg/dL — ABNORMAL HIGH (ref 70–99)

## 2020-06-27 MED ORDER — THIAMINE HCL 100 MG PO TABS
100.0000 mg | ORAL_TABLET | Freq: Every day | ORAL | Status: DC
  Administered 2020-06-28 – 2020-09-03 (×62): 100 mg
  Filled 2020-06-27 (×64): qty 1

## 2020-06-27 MED ORDER — VITAL HIGH PROTEIN PO LIQD
1000.0000 mL | ORAL | Status: DC
  Administered 2020-06-27: 1000 mL

## 2020-06-27 MED ORDER — THIAMINE HCL 100 MG/ML IJ SOLN
100.0000 mg | Freq: Every day | INTRAMUSCULAR | Status: DC
  Administered 2020-06-30 – 2020-08-13 (×6): 100 mg via INTRAVENOUS
  Filled 2020-06-27 (×13): qty 2

## 2020-06-27 MED ORDER — LORAZEPAM 2 MG/ML IJ SOLN: 1.0000 mg | INTRAMUSCULAR | Status: AC | PRN

## 2020-06-27 MED ORDER — LORAZEPAM 1 MG PO TABS: 1.0000 mg | ORAL_TABLET | ORAL | Status: AC | PRN

## 2020-06-27 MED ORDER — PROSOURCE TF PO LIQD
45.0000 mL | Freq: Two times a day (BID) | ORAL | Status: DC
  Administered 2020-06-27 – 2020-06-28 (×2): 45 mL
  Filled 2020-06-27 (×2): qty 45

## 2020-06-27 MED ORDER — DEXTROSE-NACL 5-0.45 % IV SOLN: INTRAVENOUS | Status: DC

## 2020-06-27 NOTE — Progress Notes (Signed)
Trauma Response Note: Notified Leo Grosser, NP with neurosx of head CT results.  No new orders.
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
## 2020-06-27 NOTE — Progress Notes (Signed)
Pt stopped following commands. MD notified. Stat head CT ordered.

## 2020-06-27 NOTE — Progress Notes (Signed)
Patient ID: Tami Brown, female   DOB: 1996-02-15, 24 y.o.   MRN: [id number] Patient remains intubated, no change in exam, in collar, stable

## 2020-06-28 ENCOUNTER — Encounter (HOSPITAL_COMMUNITY): Payer: Self-pay

## 2020-06-28 ENCOUNTER — Inpatient Hospital Stay (HOSPITAL_COMMUNITY): Payer: Medicaid - Out of State

## 2020-06-28 LAB — BASIC METABOLIC PANEL
Anion gap: 8 (ref 5–15)
Anion gap: 10 (ref 5–15)
BUN: 8 mg/dL (ref 6–20)
BUN: 6 mg/dL (ref 6–20)
CO2: 23 mmol/L (ref 22–32)
CO2: 23 mmol/L (ref 22–32)
Calcium: 8.7 mg/dL — ABNORMAL LOW (ref 8.9–10.3)
Calcium: 8.5 mg/dL — ABNORMAL LOW (ref 8.9–10.3)
Chloride: 118 mmol/L — ABNORMAL HIGH (ref 98–111)
Chloride: 119 mmol/L — ABNORMAL HIGH (ref 98–111)
Creatinine, Ser: 0.72 mg/dL (ref 0.44–1.00)
Creatinine, Ser: 0.69 mg/dL (ref 0.44–1.00)
GFR, Estimated: >60 mL/min (ref >60)
GFR, Estimated: >60 mL/min (ref >60)
Glucose, Bld: 168 mg/dL — ABNORMAL HIGH (ref 70–99)
Glucose, Bld: 123 mg/dL — ABNORMAL HIGH (ref 70–99)
Potassium: 4.7 mmol/L (ref 3.5–5.1)
Potassium: 3.1 mmol/L — ABNORMAL LOW (ref 3.5–5.1)
Sodium: 149 mmol/L — ABNORMAL HIGH (ref 135–145)
Sodium: 152 mmol/L — ABNORMAL HIGH (ref 135–145)

## 2020-06-28 LAB — CBC
HCT: 34.3 % — ABNORMAL LOW (ref 36.0–46.0)
Hemoglobin: 11.4 g/dL — ABNORMAL LOW (ref 12.0–15.0)
MCH: 31.9 pg (ref 26.0–34.0)
MCHC: 33.2 g/dL (ref 30.0–36.0)
MCV: 96.1 fL (ref 80.0–100.0)
Platelets: 235 10*3/uL (ref 150–400)
RBC: 3.57 MIL/uL — ABNORMAL LOW (ref 3.87–5.11)
RDW: 12.9 % (ref 11.5–15.5)
WBC: 15.7 10*3/uL — ABNORMAL HIGH (ref 4.0–10.5)
nRBC: 0 % (ref 0.0–0.2)

## 2020-06-28 LAB — PHOSPHORUS
Phosphorus: <1 mg/dL — CL (ref 2.5–4.6)
Phosphorus: 3.3 mg/dL (ref 2.5–4.6)

## 2020-06-28 LAB — MAGNESIUM: Magnesium: 2 mg/dL (ref 1.7–2.4)

## 2020-06-28 MED ORDER — ACETAMINOPHEN 500 MG PO TABS: 1000.0000 mg | ORAL_TABLET | Freq: Four times a day (QID) | ORAL | Status: DC

## 2020-06-28 MED ORDER — LORAZEPAM 2 MG/ML IJ SOLN: 1.0000 mg | INTRAMUSCULAR | Status: AC | PRN

## 2020-06-28 MED ORDER — HEPARIN SODIUM (PORCINE) 5000 UNIT/ML IJ SOLN
5000.0000 [IU] | Freq: Three times a day (TID) | INTRAMUSCULAR | Status: AC
  Administered 2020-06-28 – 2020-06-29 (×5): 5000 [IU] via SUBCUTANEOUS
  Filled 2020-06-28 (×5): qty 1

## 2020-06-28 MED ORDER — LEVETIRACETAM IN NACL 500 MG/100ML IV SOLN
500.0000 mg | Freq: Two times a day (BID) | INTRAVENOUS | Status: AC
  Administered 2020-06-28 – 2020-07-04 (×14): 500 mg via INTRAVENOUS
  Filled 2020-06-28 (×14): qty 100

## 2020-06-28 MED ORDER — METHOCARBAMOL 500 MG PO TABS
1000.0000 mg | ORAL_TABLET | Freq: Three times a day (TID) | ORAL | Status: DC
  Administered 2020-06-28 – 2020-07-13 (×42): 1000 mg
  Filled 2020-06-28 (×43): qty 2

## 2020-06-28 MED ORDER — ACETAMINOPHEN 500 MG PO TABS
1000.0000 mg | ORAL_TABLET | Freq: Four times a day (QID) | ORAL | Status: DC
  Administered 2020-06-28 – 2020-09-01 (×229): 1000 mg
  Filled 2020-06-28 (×240): qty 2

## 2020-06-28 MED ORDER — SODIUM CHLORIDE 0.9 % IV SOLN
INTRAVENOUS | Status: DC | PRN
  Administered 2020-06-28: 500 mL via INTRAVENOUS
  Administered 2020-06-30 – 2020-07-03 (×2): 250 mL via INTRAVENOUS
  Administered 2020-07-04 – 2020-07-12 (×3): 500 mL via INTRAVENOUS

## 2020-06-28 MED ORDER — SODIUM PHOSPHATES 45 MMOLE/15ML IV SOLN
45.0000 mmol | Freq: Once | INTRAVENOUS | Status: AC
  Administered 2020-06-28: 45 mmol via INTRAVENOUS
  Filled 2020-06-28: qty 15

## 2020-06-28 MED ORDER — POTASSIUM CHLORIDE 20 MEQ PO PACK
40.0000 meq | PACK | ORAL | Status: AC
  Administered 2020-06-28 – 2020-06-29 (×2): 40 meq
  Filled 2020-06-28 (×2): qty 2

## 2020-06-28 MED ORDER — PANTOPRAZOLE SODIUM 40 MG IV SOLR: 40.0000 mg | INTRAVENOUS | Status: DC

## 2020-06-28 MED ORDER — PANTOPRAZOLE SODIUM 40 MG PO PACK
40.0000 mg | PACK | Freq: Every day | ORAL | Status: DC
  Administered 2020-06-28 – 2020-09-03 (×66): 40 mg
  Filled 2020-06-28 (×69): qty 20

## 2020-06-28 MED ORDER — ENOXAPARIN SODIUM 30 MG/0.3ML ~~LOC~~ SOLN
30.0000 mg | Freq: Two times a day (BID) | SUBCUTANEOUS | Status: DC
  Administered 2020-07-02 – 2020-09-03 (×127): 30 mg via SUBCUTANEOUS
  Filled 2020-06-28 (×133): qty 0.3

## 2020-06-28 MED ORDER — PIVOT 1.5 CAL PO LIQD
1000.0000 mL | ORAL | Status: DC
  Administered 2020-06-28 – 2020-07-09 (×10): 1000 mL
  Filled 2020-06-28 (×9): qty 1000

## 2020-06-28 MED ORDER — ACETAMINOPHEN 500 MG PO TABS
1000.0000 mg | ORAL_TABLET | Freq: Four times a day (QID) | ORAL | Status: DC
  Filled 2020-06-28: qty 2

## 2020-06-28 MED ORDER — HEPARIN SODIUM (PORCINE) 5000 UNIT/ML IJ SOLN: 5000.0000 [IU] | Freq: Three times a day (TID) | INTRAMUSCULAR | Status: DC

## 2020-06-28 MED ORDER — LORAZEPAM 1 MG PO TABS: 1.0000 mg | ORAL_TABLET | ORAL | Status: AC | PRN
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
## 2020-06-28 NOTE — Procedures (Signed)
Cortrak  Tube Type:  Cortrak - 43 inches Tube Location:  Right nare Initial Placement:  Stomach Secured by: Bridle Technique Used to Measure Tube Placement:  Documented cm marking at nare/ corner of mouth Cortrak Secured At:  67 cm    Cortrak Tube Team Note:  Consult received to place a Cortrak feeding tube.   No x-ray is required. RN may begin using tube.   If the tube becomes dislodged please keep the tube and contact the Cortrak team at [email or web address] (password [email or web address]) for replacement.  If after hours and replacement cannot be delayed, place a NG tube and confirm placement with an abdominal x-ray.    Betsey Holiday MS, RD, LDN Please refer to [facility] for RD and/or RD on-call/weekend/after hours pager
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
## 2020-06-28 NOTE — Progress Notes (Signed)
RN called RT stating pt was desating and became tachypneic. RT instructed RN to place pt back in full support and increase pt's FiO2 to 60%.

## 2020-06-28 NOTE — Progress Notes (Signed)
Patient ID: Tami Brown, female   DOB: [birth year], 24 y.o.   MRN: [id number] BP 117/67   Pulse 69   Temp 99.2 F (37.3 C) (Axillary)   Resp 18   Ht 5\' 9"  (1.753 m)   Wt 72.6 kg   SpO2 99%   BMI 23.63 kg/m  Sedated, intubated May schedule corpectomy for Wednesday. Will check with trauma No neurological change. Perrl, + cough, gag No change in neurological exam.
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
## 2020-06-29 ENCOUNTER — Other Ambulatory Visit: Payer: Self-pay | Admitting: Neurosurgery

## 2020-06-29 LAB — CBC
HCT: 35.2 % — ABNORMAL LOW (ref 36.0–46.0)
Hemoglobin: 11.7 g/dL — ABNORMAL LOW (ref 12.0–15.0)
MCH: 31.7 pg (ref 26.0–34.0)
MCHC: 33.2 g/dL (ref 30.0–36.0)
MCV: 95.4 fL (ref 80.0–100.0)
Platelets: 243 10*3/uL (ref 150–400)
RBC: 3.69 MIL/uL — ABNORMAL LOW (ref 3.87–5.11)
RDW: 13.2 % (ref 11.5–15.5)
WBC: 12 10*3/uL — ABNORMAL HIGH (ref 4.0–10.5)
nRBC: 0.3 % — ABNORMAL HIGH (ref 0.0–0.2)

## 2020-06-29 LAB — BASIC METABOLIC PANEL
Anion gap: 10 (ref 5–15)
BUN: 8 mg/dL (ref 6–20)
CO2: 24 mmol/L (ref 22–32)
Calcium: 8.2 mg/dL — ABNORMAL LOW (ref 8.9–10.3)
Chloride: 121 mmol/L — ABNORMAL HIGH (ref 98–111)
Creatinine, Ser: 0.73 mg/dL (ref 0.44–1.00)
GFR, Estimated: >60 mL/min (ref >60)
Glucose, Bld: 178 mg/dL — ABNORMAL HIGH (ref 70–99)
Potassium: 3.7 mmol/L (ref 3.5–5.1)
Sodium: 155 mmol/L — ABNORMAL HIGH (ref 135–145)

## 2020-06-29 LAB — SURGICAL PCR SCREEN
MRSA, PCR: NEGATIVE
Staphylococcus aureus: POSITIVE — AB

## 2020-06-29 LAB — GLUCOSE, CAPILLARY
Glucose-Capillary: 149 mg/dL — ABNORMAL HIGH (ref 70–99)
Glucose-Capillary: 159 mg/dL — ABNORMAL HIGH (ref 70–99)
Glucose-Capillary: 180 mg/dL — ABNORMAL HIGH (ref 70–99)
Glucose-Capillary: 198 mg/dL — ABNORMAL HIGH (ref 70–99)

## 2020-06-29 LAB — PHOSPHORUS: Phosphorus: 2.2 mg/dL — ABNORMAL LOW (ref 2.5–4.6)

## 2020-06-29 LAB — MAGNESIUM: Magnesium: 2.1 mg/dL (ref 1.7–2.4)

## 2020-06-29 MED ORDER — POTASSIUM PHOSPHATES 15 MMOLE/5ML IV SOLN
30.0000 mmol | Freq: Once | INTRAVENOUS | Status: AC
  Administered 2020-06-29: 30 mmol via INTRAVENOUS
  Filled 2020-06-29: qty 10

## 2020-06-29 MED ORDER — MUPIROCIN 2 % EX OINT
1.0000 "application " | TOPICAL_OINTMENT | Freq: Two times a day (BID) | CUTANEOUS | Status: AC
  Administered 2020-06-29 – 2020-07-04 (×10): 1 via NASAL
  Filled 2020-06-29: qty 22
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
## 2020-06-29 NOTE — Progress Notes (Signed)
Patient ID: Tami Brown, female   DOB: 09/20/1995, 24 y.o.   MRN: [id number] BP 140/88   Pulse 84   Temp (!) 100.4 F (38 C) (Axillary)   Resp 18   Ht 5\' 9"  (1.753 m)   Wt 67 kg   SpO2 99%   BMI 21.81 kg/m  Intubated, sedated Perrl, mild right conjugate gaze preference +cough, +gag corneals intact Plan for OR tomorrow for C4 fracture, ligamentous disruption, to be treated via a C4 corpectomy, C3-5 arthrodesis with anterior plating.  Will discuss with father tomorrow prior to OR.  No neurological change.

## 2020-06-29 NOTE — Progress Notes (Signed)
Pt was placed back on full support due HR increased to 140's and RR increased into the 40's. Pt's ETT was also advanced due to decrease in Vt along with decrease in minute ventilation. ETT was moved from 20 @ the lip to now it is at 25 @ the lip with BL BS.

## 2020-06-30 ENCOUNTER — Inpatient Hospital Stay (HOSPITAL_COMMUNITY): Payer: Medicaid - Out of State | Admitting: Anesthesiology

## 2020-06-30 ENCOUNTER — Inpatient Hospital Stay (HOSPITAL_COMMUNITY): Payer: Medicaid - Out of State

## 2020-06-30 ENCOUNTER — Encounter (HOSPITAL_COMMUNITY): Admission: EM | Disposition: A | Discharge status: Rehabilitation Facility | Payer: Self-pay | Source: Home / Self Care

## 2020-06-30 DIAGNOSIS — S129XXA Fracture of neck, unspecified, initial encounter: Secondary | ICD-10-CM | POA: Diagnosis present

## 2020-06-30 HISTORY — PX: ANTERIOR CERVICAL CORPECTOMY: SHX1159

## 2020-06-30 LAB — CBC
HCT: 34.8 % — ABNORMAL LOW (ref 36.0–46.0)
Hemoglobin: 11.7 g/dL — ABNORMAL LOW (ref 12.0–15.0)
MCH: 32 pg (ref 26.0–34.0)
MCHC: 33.6 g/dL (ref 30.0–36.0)
MCV: 95.1 fL (ref 80.0–100.0)
Platelets: 285 10*3/uL (ref 150–400)
RBC: 3.66 MIL/uL — ABNORMAL LOW (ref 3.87–5.11)
RDW: 13.4 % (ref 11.5–15.5)
WBC: 13.3 10*3/uL — ABNORMAL HIGH (ref 4.0–10.5)
nRBC: 0.5 % — ABNORMAL HIGH (ref 0.0–0.2)

## 2020-06-30 LAB — BASIC METABOLIC PANEL
Anion gap: 10 (ref 5–15)
BUN: 9 mg/dL (ref 6–20)
CO2: 23 mmol/L (ref 22–32)
Calcium: 8.3 mg/dL — ABNORMAL LOW (ref 8.9–10.3)
Chloride: 119 mmol/L — ABNORMAL HIGH (ref 98–111)
Creatinine, Ser: 0.7 mg/dL (ref 0.44–1.00)
GFR, Estimated: >60 mL/min (ref >60)
Glucose, Bld: 154 mg/dL — ABNORMAL HIGH (ref 70–99)
Potassium: 3.6 mmol/L (ref 3.5–5.1)
Sodium: 152 mmol/L — ABNORMAL HIGH (ref 135–145)

## 2020-06-30 LAB — GLUCOSE, CAPILLARY
Glucose-Capillary: 105 mg/dL — ABNORMAL HIGH (ref 70–99)
Glucose-Capillary: 117 mg/dL — ABNORMAL HIGH (ref 70–99)
Glucose-Capillary: 119 mg/dL — ABNORMAL HIGH (ref 70–99)
Glucose-Capillary: 139 mg/dL — ABNORMAL HIGH (ref 70–99)
Glucose-Capillary: 151 mg/dL — ABNORMAL HIGH (ref 70–99)

## 2020-06-30 LAB — MAGNESIUM: Magnesium: 2 mg/dL (ref 1.7–2.4)

## 2020-06-30 LAB — PHOSPHORUS: Phosphorus: 3.2 mg/dL (ref 2.5–4.6)

## 2020-06-30 SURGERY — ANTERIOR CERVICAL CORPECTOMY
Anesthesia: General | Site: Spine Cervical

## 2020-06-30 MED ORDER — THROMBIN 20000 UNITS EX SOLR
CUTANEOUS | Status: AC
  Filled 2020-06-30: qty 20000

## 2020-06-30 MED ORDER — MIDAZOLAM HCL (PF) 10 MG/2ML IJ SOLN
10.0000 mg | Freq: Once | INTRAMUSCULAR | Status: DC | PRN
  Filled 2020-06-30: qty 2

## 2020-06-30 MED ORDER — CHLORHEXIDINE GLUCONATE CLOTH 2 % EX PADS: 6.0000 | MEDICATED_PAD | Freq: Once | CUTANEOUS | Status: DC

## 2020-06-30 MED ORDER — POTASSIUM CHLORIDE 20 MEQ/15ML (10%) PO SOLN: 40.0000 meq | Freq: Once | ORAL | Status: DC

## 2020-06-30 MED ORDER — THROMBIN 5000 UNITS EX SOLR
OROMUCOSAL | Status: DC | PRN
  Administered 2020-06-30: 5 mL via TOPICAL

## 2020-06-30 MED ORDER — DEXAMETHASONE SODIUM PHOSPHATE 10 MG/ML IJ SOLN
INTRAMUSCULAR | Status: AC
  Filled 2020-06-30: qty 1

## 2020-06-30 MED ORDER — ROCURONIUM BROMIDE 10 MG/ML (PF) SYRINGE
PREFILLED_SYRINGE | INTRAVENOUS | Status: DC | PRN
  Administered 2020-06-30: 40 mg via INTRAVENOUS
  Administered 2020-06-30: 90 mg via INTRAVENOUS

## 2020-06-30 MED ORDER — LACTATED RINGERS IV SOLN: INTRAVENOUS | Status: DC | PRN

## 2020-06-30 MED ORDER — MIDAZOLAM HCL 2 MG/2ML IJ SOLN
INTRAMUSCULAR | Status: AC
  Filled 2020-06-30: qty 10

## 2020-06-30 MED ORDER — FENTANYL CITRATE (PF) 250 MCG/5ML IJ SOLN
INTRAMUSCULAR | Status: AC
  Filled 2020-06-30: qty 5

## 2020-06-30 MED ORDER — THROMBIN 5000 UNITS EX SOLR
CUTANEOUS | Status: AC
  Filled 2020-06-30: qty 5000

## 2020-06-30 MED ORDER — DEXAMETHASONE SODIUM PHOSPHATE 10 MG/ML IJ SOLN
INTRAMUSCULAR | Status: DC | PRN
  Administered 2020-06-30: 10 mg via INTRAVENOUS

## 2020-06-30 MED ORDER — FENTANYL CITRATE (PF) 100 MCG/2ML IJ SOLN: 100.0000 ug | Freq: Once | INTRAMUSCULAR | Status: DC | PRN

## 2020-06-30 MED ORDER — FENTANYL CITRATE (PF) 100 MCG/2ML IJ SOLN
INTRAMUSCULAR | Status: AC
  Filled 2020-06-30: qty 2

## 2020-06-30 MED ORDER — ROCURONIUM BROMIDE 10 MG/ML (PF) SYRINGE
PREFILLED_SYRINGE | INTRAVENOUS | Status: AC
  Filled 2020-06-30: qty 10

## 2020-06-30 MED ORDER — FENTANYL CITRATE (PF) 250 MCG/5ML IJ SOLN
INTRAMUSCULAR | Status: DC | PRN
  Administered 2020-06-30: 50 ug via INTRAVENOUS
  Administered 2020-06-30: 100 ug via INTRAVENOUS
  Administered 2020-06-30 (×2): 50 ug via INTRAVENOUS

## 2020-06-30 MED ORDER — LIDOCAINE-EPINEPHRINE 0.5 %-1:200000 IJ SOLN
INTRAMUSCULAR | Status: AC
  Filled 2020-06-30: qty 1

## 2020-06-30 MED ORDER — ROCURONIUM BROMIDE 10 MG/ML (PF) SYRINGE
PREFILLED_SYRINGE | INTRAVENOUS | Status: AC
  Filled 2020-06-30: qty 20

## 2020-06-30 MED ORDER — CEFAZOLIN SODIUM 1 G IJ SOLR
INTRAMUSCULAR | Status: AC
  Filled 2020-06-30: qty 20

## 2020-06-30 MED ORDER — 0.9 % SODIUM CHLORIDE (POUR BTL) OPTIME
TOPICAL | Status: DC | PRN
  Administered 2020-06-30: 1000 mL

## 2020-06-30 MED ORDER — CEFAZOLIN SODIUM-DEXTROSE 2-3 GM-%(50ML) IV SOLR
INTRAVENOUS | Status: DC | PRN
  Administered 2020-06-30: 2 g via INTRAVENOUS

## 2020-06-30 MED ORDER — ESMOLOL HCL 100 MG/10ML IV SOLN
INTRAVENOUS | Status: AC
  Filled 2020-06-30: qty 10

## 2020-06-30 MED ORDER — THROMBIN 20000 UNITS EX SOLR: CUTANEOUS | Status: DC | PRN

## 2020-06-30 MED ORDER — LIDOCAINE-EPINEPHRINE 0.5 %-1:200000 IJ SOLN
INTRAMUSCULAR | Status: DC | PRN
  Administered 2020-06-30: 6 mL

## 2020-06-30 MED ORDER — POTASSIUM CHLORIDE 20 MEQ/15ML (10%) PO SOLN
40.0000 meq | Freq: Once | ORAL | Status: AC
  Administered 2020-06-30: 40 meq
  Filled 2020-06-30: qty 30

## 2020-06-30 SURGICAL SUPPLY — 66 items
ADH SKN CLS APL DERMABOND .7 (GAUZE/BANDAGES/DRESSINGS) ×2
BAND INSRT 18 STRL LF DISP RB (MISCELLANEOUS) ×4
BAND RUBBER #18 3X1/16 STRL (MISCELLANEOUS) ×6 IMPLANT
BASKET BONE COLLECTION (BASKET) ×3 IMPLANT
BLADE CLIPPER SURG (BLADE) IMPLANT
BUR DRUM 4.0 (BURR) ×3 IMPLANT
BUR MATCHSTICK NEURO 3.0 LAGG (BURR) ×3 IMPLANT
CAGE NIKO 23 SPINAL (Cage) ×3 IMPLANT
CANISTER SUCT 3000ML PPV (MISCELLANEOUS) ×6 IMPLANT
CARTRIDGE OIL MAESTRO DRILL (MISCELLANEOUS) ×2 IMPLANT
COVER WAND RF STERILE (DRAPES) ×6 IMPLANT
DECANTER SPIKE VIAL GLASS SM (MISCELLANEOUS) ×3 IMPLANT
DERMABOND ADVANCED (GAUZE/BANDAGES/DRESSINGS) ×1
DERMABOND ADVANCED .7 DNX12 (GAUZE/BANDAGES/DRESSINGS) ×2 IMPLANT
DIFFUSER DRILL AIR PNEUMATIC (MISCELLANEOUS) ×3 IMPLANT
DRAPE HALF SHEET 40X57 (DRAPES) IMPLANT
DRAPE LAPAROTOMY 100X72 PEDS (DRAPES) ×3 IMPLANT
DRAPE MICROSCOPE LEICA (MISCELLANEOUS) ×3 IMPLANT
DRAPE UTILITY XL STRL (DRAPES) ×3 IMPLANT
DURAPREP 6ML APPLICATOR 50/CS (WOUND CARE) ×3 IMPLANT
ELECT CAUTERY BLADE 6.4 (BLADE) ×3 IMPLANT
ELECT COATED BLADE 2.86 ST (ELECTRODE) ×3 IMPLANT
ELECT REM PT RETURN 9FT ADLT (ELECTROSURGICAL) ×6
ELECTRODE REM PT RTRN 9FT ADLT (ELECTROSURGICAL) ×4 IMPLANT
GAUZE 4X4 16PLY RFD (DISPOSABLE) ×3 IMPLANT
GLOVE BIO SURGEON STRL SZ 6.5 (GLOVE) ×3 IMPLANT
GLOVE BIO SURGEON STRL SZ7 (GLOVE) ×3 IMPLANT
GLOVE BIOGEL PI IND STRL 6 (GLOVE) ×2 IMPLANT
GLOVE BIOGEL PI IND STRL 7.5 (GLOVE) ×2 IMPLANT
GLOVE BIOGEL PI INDICATOR 6 (GLOVE) ×1
GLOVE BIOGEL PI INDICATOR 7.5 (GLOVE) ×1
GLOVE ECLIPSE 6.5 STRL STRAW (GLOVE) ×3 IMPLANT
GLOVE EXAM NITRILE XL STR (GLOVE) IMPLANT
GOWN STRL REUS W/ TWL LRG LVL3 (GOWN DISPOSABLE) ×10 IMPLANT
GOWN STRL REUS W/ TWL XL LVL3 (GOWN DISPOSABLE) IMPLANT
GOWN STRL REUS W/TWL 2XL LVL3 (GOWN DISPOSABLE) IMPLANT
GOWN STRL REUS W/TWL LRG LVL3 (GOWN DISPOSABLE) ×15
GOWN STRL REUS W/TWL XL LVL3 (GOWN DISPOSABLE)
HALTER HD/CHIN CERV TRACTION D (MISCELLANEOUS) IMPLANT
HEMOSTAT SURGICEL 2X14 (HEMOSTASIS) IMPLANT
INTRODUCER TRACH BLUE RHINO 6F (TUBING) IMPLANT
INTRODUCER TRACH BLUE RHINO 8F (TUBING) IMPLANT
KIT BASIN OR (CUSTOM PROCEDURE TRAY) ×6 IMPLANT
KIT TURNOVER KIT B (KITS) ×6 IMPLANT
NEEDLE HYPO 25X1 1.5 SAFETY (NEEDLE) ×3 IMPLANT
NEEDLE SPNL 22GX3.5 QUINCKE BK (NEEDLE) ×3 IMPLANT
NS IRRIG 1000ML POUR BTL (IV SOLUTION) ×6 IMPLANT
OIL CARTRIDGE MAESTRO DRILL (MISCELLANEOUS) ×3
PACK EENT II TURBAN DRAPE (CUSTOM PROCEDURE TRAY) ×3 IMPLANT
PACK LAMINECTOMY NEURO (CUSTOM PROCEDURE TRAY) ×3 IMPLANT
PAD ARMBOARD 7.5X6 YLW CONV (MISCELLANEOUS) ×3 IMPLANT
PENCIL BUTTON HOLSTER BLD 10FT (ELECTRODE) ×3 IMPLANT
PIN DISTRACTION 14MM (PIN) IMPLANT
PLATE ANT CERV XTEND 2 LV 30 (Plate) ×3 IMPLANT
SCREW VAR 4.2 XD SELF DRILL 12 (Screw) ×12 IMPLANT
SPONGE INTESTINAL PEANUT (DISPOSABLE) ×6 IMPLANT
SPONGE SURGIFOAM ABS GEL 100 (HEMOSTASIS) IMPLANT
SUT VIC AB 0 CT1 27 (SUTURE)
SUT VIC AB 0 CT1 27XBRD ANTBC (SUTURE) IMPLANT
SUT VIC AB 3-0 SH 8-18 (SUTURE) ×3 IMPLANT
SUT VICRYL AB 3 0 TIES (SUTURE) ×3 IMPLANT
SYR 20ML LL LF (SYRINGE) ×3 IMPLANT
TOWEL GREEN STERILE (TOWEL DISPOSABLE) ×6 IMPLANT
TOWEL GREEN STERILE FF (TOWEL DISPOSABLE) ×6 IMPLANT
TUBE CONNECTING 12X1/4 (SUCTIONS) ×3 IMPLANT
WATER STERILE IRR 1000ML POUR (IV SOLUTION) ×3 IMPLANT

## 2020-06-30 NOTE — Progress Notes (Signed)
Dr. Bedelia Person notified of patient's left pupil being larger than right. This has also been charted in previous flowcharts.  Pupils round and reactive. Patient still responsive, and patient localizes pain and purposeful with right arm.
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
## 2020-06-30 NOTE — Progress Notes (Signed)
Patient to OR at this time

## 2020-06-30 NOTE — Progress Notes (Signed)
Pt not available for last ventilator check for today, pt in OR at this time.
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
## 2020-06-30 NOTE — Transfer of Care (Signed)
Immediate Anesthesia Transfer of Care Note  Patient: Tami Brown  Procedure(s) Performed: Cervical Four Corpectomy (N/A Spine Cervical)  Patient Location: ICU  Anesthesia Type:General  Level of Consciousness: sedated and Patient remains intubated per anesthesia plan  Airway & Oxygen Therapy: Patient remains intubated per anesthesia plan and Patient placed on Ventilator (see vital sign flow sheet for setting)  Post-op Assessment: Report given to RN and Post -op Vital signs reviewed and stable  Post vital signs: Reviewed and stable  Last Vitals:  Vitals Value Taken Time  BP 124/73 06/30/20 1954  Temp    Pulse 103 06/30/20 1955  Resp 18 06/30/20 1955  SpO2 92 % 06/30/20 1955  Vitals shown include unvalidated device data.  Last Pain:  Vitals:   06/30/20 1200  TempSrc: Axillary  PainSc:          Complications: No complications documented.

## 2020-06-30 NOTE — Progress Notes (Signed)
Dr. Bedelia Person notified RT that she had placed pt in PS/CPAP. Pt tolerating well at this time.

## 2020-06-30 NOTE — Progress Notes (Signed)
Wasted 23 mL of fentanyl from previous tubing with Roselyn Bering RN.
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
## 2020-07-01 ENCOUNTER — Encounter (HOSPITAL_COMMUNITY): Payer: Self-pay | Admitting: Certified Registered Nurse Anesthetist

## 2020-07-01 ENCOUNTER — Inpatient Hospital Stay (HOSPITAL_COMMUNITY): Payer: Medicaid - Out of State | Admitting: Certified Registered Nurse Anesthetist

## 2020-07-01 ENCOUNTER — Encounter (HOSPITAL_COMMUNITY): Payer: Self-pay | Admitting: Neurosurgery

## 2020-07-01 ENCOUNTER — Encounter (HOSPITAL_COMMUNITY): Admission: EM | Disposition: A | Discharge status: Rehabilitation Facility | Payer: Self-pay | Source: Home / Self Care

## 2020-07-01 ENCOUNTER — Inpatient Hospital Stay (HOSPITAL_COMMUNITY): Payer: Medicaid - Out of State

## 2020-07-01 HISTORY — PX: TRACHEOSTOMY TUBE PLACEMENT: SHX814

## 2020-07-01 HISTORY — PX: CLOSED REDUCTION NASAL FRACTURE: SHX5365

## 2020-07-01 HISTORY — PX: ORIF MANDIBULAR FRACTURE: SHX2127

## 2020-07-01 LAB — CBC
HCT: 33.5 % — ABNORMAL LOW (ref 36.0–46.0)
Hemoglobin: 11.3 g/dL — ABNORMAL LOW (ref 12.0–15.0)
MCH: 31.7 pg (ref 26.0–34.0)
MCHC: 33.7 g/dL (ref 30.0–36.0)
MCV: 93.8 fL (ref 80.0–100.0)
Platelets: 357 10*3/uL (ref 150–400)
RBC: 3.57 MIL/uL — ABNORMAL LOW (ref 3.87–5.11)
RDW: 13.4 % (ref 11.5–15.5)
WBC: 18 10*3/uL — ABNORMAL HIGH (ref 4.0–10.5)
nRBC: 0.2 % (ref 0.0–0.2)

## 2020-07-01 LAB — GLUCOSE, CAPILLARY
Glucose-Capillary: 140 mg/dL — ABNORMAL HIGH (ref 70–99)
Glucose-Capillary: 116 mg/dL — ABNORMAL HIGH (ref 70–99)
Glucose-Capillary: 104 mg/dL — ABNORMAL HIGH (ref 70–99)
Glucose-Capillary: 103 mg/dL — ABNORMAL HIGH (ref 70–99)
Glucose-Capillary: 118 mg/dL — ABNORMAL HIGH (ref 70–99)
Glucose-Capillary: 110 mg/dL — ABNORMAL HIGH (ref 70–99)

## 2020-07-01 LAB — BASIC METABOLIC PANEL
Anion gap: 15 (ref 5–15)
BUN: 13 mg/dL (ref 6–20)
CO2: 19 mmol/L — ABNORMAL LOW (ref 22–32)
Calcium: 8.4 mg/dL — ABNORMAL LOW (ref 8.9–10.3)
Chloride: 112 mmol/L — ABNORMAL HIGH (ref 98–111)
Creatinine, Ser: 0.79 mg/dL (ref 0.44–1.00)
GFR, Estimated: >60 mL/min (ref >60)
Glucose, Bld: 137 mg/dL — ABNORMAL HIGH (ref 70–99)
Potassium: 4.2 mmol/L (ref 3.5–5.1)
Sodium: 146 mmol/L — ABNORMAL HIGH (ref 135–145)

## 2020-07-01 LAB — PHOSPHORUS: Phosphorus: 3.5 mg/dL (ref 2.5–4.6)

## 2020-07-01 LAB — MAGNESIUM: Magnesium: 2.1 mg/dL (ref 1.7–2.4)

## 2020-07-01 SURGERY — CLOSED REDUCTION, FRACTURE, NASAL BONE
Anesthesia: General | Site: Nose

## 2020-07-01 MED ORDER — LACTATED RINGERS IV SOLN: INTRAVENOUS | Status: DC | PRN

## 2020-07-01 MED ORDER — FENTANYL CITRATE (PF) 250 MCG/5ML IJ SOLN
INTRAMUSCULAR | Status: DC | PRN
  Administered 2020-07-01 (×5): 50 ug via INTRAVENOUS

## 2020-07-01 MED ORDER — LIDOCAINE-EPINEPHRINE 1 %-1:100000 IJ SOLN
INTRAMUSCULAR | Status: AC
  Filled 2020-07-01: qty 1

## 2020-07-01 MED ORDER — ESMOLOL HCL 100 MG/10ML IV SOLN
INTRAVENOUS | Status: AC
  Filled 2020-07-01: qty 10

## 2020-07-01 MED ORDER — LIDOCAINE-EPINEPHRINE 1 %-1:100000 IJ SOLN
INTRAMUSCULAR | Status: DC | PRN
  Administered 2020-07-01: 10 mL

## 2020-07-01 MED ORDER — PANTOPRAZOLE SODIUM 40 MG IV SOLR
40.0000 mg | Freq: Once | INTRAVENOUS | Status: AC
  Administered 2020-07-01: 40 mg via INTRAVENOUS
  Filled 2020-07-01: qty 40

## 2020-07-01 MED ORDER — DEXAMETHASONE SODIUM PHOSPHATE 10 MG/ML IJ SOLN
INTRAMUSCULAR | Status: DC | PRN
  Administered 2020-07-01: 5 mg via INTRAVENOUS

## 2020-07-01 MED ORDER — FENTANYL CITRATE (PF) 250 MCG/5ML IJ SOLN
INTRAMUSCULAR | Status: AC
  Filled 2020-07-01: qty 5

## 2020-07-01 MED ORDER — PROPOFOL 500 MG/50ML IV EMUL
INTRAVENOUS | Status: DC | PRN
  Administered 2020-07-01: 75 ug/kg/min via INTRAVENOUS

## 2020-07-01 MED ORDER — MIDAZOLAM HCL 2 MG/2ML IJ SOLN
INTRAMUSCULAR | Status: AC
  Filled 2020-07-01: qty 2

## 2020-07-01 MED ORDER — CEFAZOLIN SODIUM-DEXTROSE 2-3 GM-%(50ML) IV SOLR
INTRAVENOUS | Status: DC | PRN
  Administered 2020-07-01: 2 g via INTRAVENOUS

## 2020-07-01 MED ORDER — MIDAZOLAM HCL 2 MG/2ML IJ SOLN
INTRAMUSCULAR | Status: DC | PRN
  Administered 2020-07-01: 2 mg via INTRAVENOUS

## 2020-07-01 MED ORDER — PROPOFOL 10 MG/ML IV BOLUS
INTRAVENOUS | Status: AC
  Filled 2020-07-01: qty 20

## 2020-07-01 MED ORDER — 0.9 % SODIUM CHLORIDE (POUR BTL) OPTIME
TOPICAL | Status: DC | PRN
  Administered 2020-07-01: 1000 mL

## 2020-07-01 MED ORDER — PHENYLEPHRINE HCL-NACL 10-0.9 MG/250ML-% IV SOLN
INTRAVENOUS | Status: DC | PRN
  Administered 2020-07-01: 20 ug/min via INTRAVENOUS

## 2020-07-01 MED ORDER — OXYMETAZOLINE HCL 0.05 % NA SOLN
NASAL | Status: AC
  Filled 2020-07-01: qty 30

## 2020-07-01 MED ORDER — ROCURONIUM BROMIDE 10 MG/ML (PF) SYRINGE
PREFILLED_SYRINGE | INTRAVENOUS | Status: DC | PRN
  Administered 2020-07-01 (×2): 30 mg via INTRAVENOUS
  Administered 2020-07-01: 40 mg via INTRAVENOUS

## 2020-07-01 MED ORDER — ONDANSETRON HCL 4 MG/2ML IJ SOLN
INTRAMUSCULAR | Status: DC | PRN
  Administered 2020-07-01: 4 mg via INTRAVENOUS

## 2020-07-01 SURGICAL SUPPLY — 61 items
BIT DRILL 1.6X115 (BIT) ×3
BIT DRILL 1.6X115MM (BIT) IMPLANT
BLADE CLIPPER SURG (BLADE) IMPLANT
BLADE SURG 15 STRL LF DISP TIS (BLADE) ×3 IMPLANT
BLADE SURG 15 STRL SS (BLADE) ×4
CANISTER SUCT 3000ML PPV (MISCELLANEOUS) ×4 IMPLANT
CLEANER TIP ELECTROSURG 2X2 (MISCELLANEOUS) ×4 IMPLANT
COVER BACK TABLE 60X90IN (DRAPES) ×4 IMPLANT
COVER MAYO STAND STRL (DRAPES) ×5 IMPLANT
COVER SURGICAL LIGHT HANDLE (MISCELLANEOUS) ×4 IMPLANT
COVER WAND RF STERILE (DRAPES) ×4 IMPLANT
DRAPE CAMERA VIDEO/LASER (DRAPES) ×1 IMPLANT
DRILL BIT 1.6X115MM (BIT) ×4
ELECT COATED BLADE 2.86 ST (ELECTRODE) ×4 IMPLANT
ELECT REM PT RETURN 9FT ADLT (ELECTROSURGICAL) ×4
ELECTRODE REM PT RTRN 9FT ADLT (ELECTROSURGICAL) ×3 IMPLANT
GAUZE 4X4 16PLY RFD (DISPOSABLE) ×4 IMPLANT
GAUZE SPONGE 2X2 8PLY STRL LF (GAUZE/BANDAGES/DRESSINGS) ×3 IMPLANT
GLOVE BIO SURGEON STRL SZ 6.5 (GLOVE) ×4 IMPLANT
GOWN STRL REUS W/ TWL LRG LVL3 (GOWN DISPOSABLE) ×6 IMPLANT
GOWN STRL REUS W/TWL LRG LVL3 (GOWN DISPOSABLE) ×8
INTRODUCER TRACH BLUE RHINO 6F (TUBING) ×1 IMPLANT
KIT BASIN OR (CUSTOM PROCEDURE TRAY) ×4 IMPLANT
KIT SPLINT NASAL DENVER PET BE (GAUZE/BANDAGES/DRESSINGS) ×1 IMPLANT
KIT TURNOVER KIT B (KITS) ×4 IMPLANT
NDL HYPO 25GX1X1/2 BEV (NEEDLE) ×3 IMPLANT
NDL PRECISIONGLIDE 27X1.5 (NEEDLE) ×3 IMPLANT
NEEDLE HYPO 25GX1X1/2 BEV (NEEDLE) ×4 IMPLANT
NEEDLE PRECISIONGLIDE 27X1.5 (NEEDLE) ×4 IMPLANT
NS IRRIG 1000ML POUR BTL (IV SOLUTION) ×4 IMPLANT
PAD ARMBOARD 7.5X6 YLW CONV (MISCELLANEOUS) ×8 IMPLANT
PATTIES SURGICAL .5 X3 (DISPOSABLE) ×4 IMPLANT
PENCIL BUTTON HOLSTER BLD 10FT (ELECTRODE) ×5 IMPLANT
PLATE LOCK 4H STRT 1.6 GOLD (Plate) ×1 IMPLANT
POSITIONER HEAD DONUT 9IN (MISCELLANEOUS) ×4 IMPLANT
PROTECTOR CORNEAL (OPHTHALMIC RELATED) IMPLANT
SCISSORS WIRE ANG 4 3/4 DISP (INSTRUMENTS) ×4 IMPLANT
SCREW NON LOCK HT 2.0X16 (Screw) ×4 IMPLANT
SET WALTER ACTIVATION W/DRAPE (SET/KITS/TRAYS/PACK) ×1 IMPLANT
SPLINT NASAL DOYLE BI-VL (GAUZE/BANDAGES/DRESSINGS) IMPLANT
SPLINT NASAL THERMO PLAST (MISCELLANEOUS) ×4 IMPLANT
SPONGE DRAIN TRACH 4X4 STRL 2S (GAUZE/BANDAGES/DRESSINGS) ×1 IMPLANT
SPONGE GAUZE 2X2 STER 10/PKG (GAUZE/BANDAGES/DRESSINGS) ×1
SUT MON AB 3-0 SH 27 (SUTURE) ×8
SUT MON AB 3-0 SH27 (SUTURE) ×6 IMPLANT
SUT PROLENE 6 0 PC 1 (SUTURE) IMPLANT
SUT SILK 3 0 SH 30 (SUTURE) ×1 IMPLANT
SUT STEEL 0 (SUTURE)
SUT STEEL 0 18XMFL TIE 17 (SUTURE) IMPLANT
SUT STEEL 1 (SUTURE) IMPLANT
SUT STEEL 2 (SUTURE) IMPLANT
SUT STEEL 4 (SUTURE) IMPLANT
SUT VIC AB 4-0 PS2 18 (SUTURE) ×1 IMPLANT
SUT VIC AB 4-0 PS2 27 (SUTURE) ×1 IMPLANT
SUT VICRYL 4-0 PS2 18IN ABS (SUTURE) IMPLANT
SYR CONTROL 10ML LL (SYRINGE) ×4 IMPLANT
TOWEL GREEN STERILE (TOWEL DISPOSABLE) ×4 IMPLANT
TOWEL GREEN STERILE FF (TOWEL DISPOSABLE) ×4 IMPLANT
TRAY ENT MC OR (CUSTOM PROCEDURE TRAY) ×4 IMPLANT
TUBE CONNECTING 12X1/4 (SUCTIONS) ×4 IMPLANT
WATER STERILE IRR 1000ML POUR (IV SOLUTION) ×4 IMPLANT
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
## 2020-07-01 NOTE — Op Note (Signed)
DATE OF OPERATION: 07/01/2020  LOCATION: [facility]  PREOPERATIVE DIAGNOSIS: Mandible and nasal fracture  POSTOPERATIVE DIAGNOSIS: Same  PROCEDURE:  1. Open reduction internal fixation of mandible fracture 2. Closed reduction of nasal fracture  SURGEON: Almando Brawley [place] Malayah Demuro, DO  ASSISTANT: Keenan Bachelor, PA  EBL: 5 cc  CONDITION: Stable  COMPLICATIONS: None  INDICATION: The patient, Benjamin, is a 24 y.o. female born on Jul 21, 1996, is here for treatment of a mandible fracture and nasal fracture.   PROCEDURE DETAILS:  The patient was seen prior to surgery and marked.  The consent was obtained from the patient's mother and confirmed with 2 nurses. The patient was taken to the operating room. SCDs were placed.  General anesthesia was administered the patient underwent a tracheostomy by general surgery.  When they were finished with their portion of the case the patient was rendered to the plastic surgery service.  The IV antibiotics were given.  A standard time out was performed and all information was confirmed by those in the room.  The patient was prepped and draped in a sterile fashion.  The patient's occlusion was checked.  She had an open bite.  This seemed to be more of muscle tightness then her occlusion.  This was a right-sided mandible fracture.  Local with epinephrine was injected in the buccal mucosa of the right mandible.  The patient was not placed in occlusion so as not to disrupt her braces.  Instead we held her in occlusion.  The Bovie was used to dissect down to the bone leaving a enough of the buccal mucosa to close.  The periosteal elevator was used to free the fracture.  There is pretty good placement of the fracture.  There did not appear to be depression laterally or inferior displacement.  It looked more like it was displacement of the actual tooth that was less prominent lateral to the fracture.  I was able to loosen the mandible  muscles and obtain occlusion.  The bite was slightly off to the left.  As I tried to reduce it nothing seem to matter.  This may be related to her orthodontics.  It is difficult to say.  The fracture was nicely in place with pressure.  A plate was applied.  The plate was bent to coapt to the bone.  The drill was used and a 15-16 mm screw was placed with 2 on either side of the fracture site.  The the mentalis muscle was reapproximated with 4-0 Vicryl.  The buccal mucosa was closed with 4-0 Vicryl vertical mattress and simple interrupted sutures.  Nasal fracture.  The speculum was placed and the left-sided nasal fracture was reduced.  There was good symmetry noted.  The nose was cleaned and a [place] splint was applied.  The posterior pharynx was suctioned and the patient was stable.  The patient was taken back to the ICU in stable condition at the end of the case. The family was notified at the end of the case.   The advanced practice practitioner (APP) assisted throughout the case.  The APP was essential in retraction and counter traction when needed to make the case progress smoothly.  This retraction and assistance made it possible to see the tissue plans for the procedure.  The assistance was needed for blood control, tissue re-approximation and assisted with closure of the incision site.

## 2020-07-01 NOTE — Progress Notes (Signed)
Endo Tech Faustina M and Chris C at bedside 06/30/20 for PEG placement at 1530. Scope in at 1542 Incision Time 1551 Scope out at 1559 and they left the room at 1604. PEG documented. Weston Settle, RN

## 2020-07-01 NOTE — Op Note (Signed)
   Operative Note  Date: 07/01/2020  Procedure: percutaneous tracheostomy without bronchoscopic assistance  Pre-op diagnosis: prolonged mechanical ventilation Post-op diagnosis: same  Indication and clinical history: The patient  is a 24 y.o. year old female with traumatic brain injury  Surgeon: Jesusita Oka, MD  Anesthesia: general  Findings:  . Specimen: none . EBL: <5cc  . Drains/Implants: #6 Shiley cuffed tracheostomy tube  Disposition: ICU  Description of procedure: The patient was positioned supine with a shoulder roll. Time-out was performed verifying correct patient, procedure, signature of informed consent, and pre-operative antibiotics as indicated. MAC induction was uneventful and the patient was confirmed to be on 100% FiO2. The neck was prepped and draped in the usual sterile fashion. Palpation of neck anatomy was performed. A longitudinal incision was made in the neck and deepened down to the trachea.   The pilot balloon was deflated and the endotracheal tube slowly retracted proximally until the tip of the endotracheal tube was palpated just below the cricoid cartilage. An introducer needle was inserted between the second and third tracheal rings. A guidewire was passed through the introducer needle and the needle removed. Serial dilation was performed and a #6 cuffed Shiley and stylet were inserted over the guidewire and the guidewire and stylet removed. The inner cannula was inserted, the pilot balloon on the tracheostomy inflated, and the ventilator tubing disconnected from the endotracheal tube and connected to the tracheostomy. Chest rise, appropriate end-tidal CO2, and return tidal volumes were confirmed. The tracheostomy tube was sutured in four quadrants and a tracheostomy tie applied to the neck. The endotracheal tube was removed. The patient tolerated the procedure well. There were no complications. Post-procedure chest x-ray was ordered to confirm tube position  and the absence of a pneumothorax.  Jesusita Oka, MD General and [place] [facility]

## 2020-07-01 NOTE — Transfer of Care (Signed)
Immediate Anesthesia Transfer of Care Note  Patient: Tami Brown  Procedure(s) Performed: CLOSED REDUCTION NASAL FRACTURE (N/A Nose) OPEN REDUCTION INTERNAL FIXATION (ORIF) MANDIBULAR FRACTURE (N/A Mouth) TRACHEOSTOMY (N/A Neck)  Patient Location: ICU  Anesthesia Type:General  Level of Consciousness: Patient remains intubated per anesthesia plan  Airway & Oxygen Therapy: Patient placed on Ventilator (see vital sign flow sheet for setting)  Post-op Assessment: Report given to RN and Post -op Vital signs reviewed and stable  Post vital signs: Reviewed  Last Vitals:  Vitals Value Taken Time  BP 141/83 07/01/20 1615  Temp    Pulse 96 07/01/20 1617  Resp 18 07/01/20 1617  SpO2 97 % 07/01/20 1617  Vitals shown include unvalidated device data.  Last Pain:  Vitals:   07/01/20 1200  TempSrc: Axillary  PainSc:          Complications: No complications documented.

## 2020-07-01 NOTE — Progress Notes (Signed)
Plan of care to trach with MD Lovick in OR with MD Dillingham discussed with MD Dillingham at bedside. MD Dillingham discussed procedure over phone with pt's mother. All questions have been answered by care team at this time and concents verified.
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
## 2020-07-01 NOTE — OR Nursing (Signed)
Called ICU RN at 15:48 to tell them we would be arriving in a few minutes.

## 2020-07-01 NOTE — Progress Notes (Signed)
Patient ID: Tami Brown, female   DOB: 01/15/1996, 24 y.o.   MRN: [id number] Patient is currently in the operating room. By nursing report she was at her baseline preop. Her wound was clean, dry, no signs of infection.  Spoke with Dr. Bedelia Person and approved removing the cervical collar.

## 2020-07-02 ENCOUNTER — Encounter (HOSPITAL_COMMUNITY): Payer: Self-pay | Admitting: Plastic Surgery

## 2020-07-02 ENCOUNTER — Encounter (HOSPITAL_COMMUNITY): Admission: EM | Disposition: A | Discharge status: Rehabilitation Facility | Payer: Self-pay | Source: Home / Self Care

## 2020-07-02 LAB — CBC
HCT: 32.7 % — ABNORMAL LOW (ref 36.0–46.0)
Hemoglobin: 10.9 g/dL — ABNORMAL LOW (ref 12.0–15.0)
MCH: 31.5 pg (ref 26.0–34.0)
MCHC: 33.3 g/dL (ref 30.0–36.0)
MCV: 94.5 fL (ref 80.0–100.0)
Platelets: 251 10*3/uL (ref 150–400)
RBC: 3.46 MIL/uL — ABNORMAL LOW (ref 3.87–5.11)
RDW: 13.6 % (ref 11.5–15.5)
WBC: 17.4 10*3/uL — ABNORMAL HIGH (ref 4.0–10.5)
nRBC: 0.2 % (ref 0.0–0.2)

## 2020-07-02 LAB — MAGNESIUM: Magnesium: 2.1 mg/dL (ref 1.7–2.4)

## 2020-07-02 LAB — PHOSPHORUS: Phosphorus: 3.1 mg/dL (ref 2.5–4.6)

## 2020-07-02 LAB — GLUCOSE, CAPILLARY
Glucose-Capillary: 157 mg/dL — ABNORMAL HIGH (ref 70–99)
Glucose-Capillary: 183 mg/dL — ABNORMAL HIGH (ref 70–99)
Glucose-Capillary: 187 mg/dL — ABNORMAL HIGH (ref 70–99)
Glucose-Capillary: 193 mg/dL — ABNORMAL HIGH (ref 70–99)
Glucose-Capillary: 197 mg/dL — ABNORMAL HIGH (ref 70–99)
Glucose-Capillary: 200 mg/dL — ABNORMAL HIGH (ref 70–99)

## 2020-07-02 LAB — BASIC METABOLIC PANEL
Anion gap: 10 (ref 5–15)
BUN: 15 mg/dL (ref 6–20)
CO2: 21 mmol/L — ABNORMAL LOW (ref 22–32)
Calcium: 8.3 mg/dL — ABNORMAL LOW (ref 8.9–10.3)
Chloride: 111 mmol/L (ref 98–111)
Creatinine, Ser: 0.71 mg/dL (ref 0.44–1.00)
GFR, Estimated: >60 mL/min (ref >60)
Glucose, Bld: 166 mg/dL — ABNORMAL HIGH (ref 70–99)
Potassium: 3.7 mmol/L (ref 3.5–5.1)
Sodium: 142 mmol/L (ref 135–145)

## 2020-07-02 SURGERY — CREATION, TRACHEOSTOMY
Anesthesia: General

## 2020-07-02 MED ORDER — MIDAZOLAM HCL 2 MG/2ML IJ SOLN
2.0000 mg | Freq: Once | INTRAMUSCULAR | Status: AC
  Administered 2020-07-02: 2 mg via INTRAVENOUS
  Filled 2020-07-02: qty 2

## 2020-07-02 NOTE — Progress Notes (Signed)
Patient ID: Tami Brown, female   DOB: 1996/07/18, 24 y.o.   MRN: [id number] BP (!) 142/83    Pulse (!) 116    Temp 100.2 F (37.9 C) (Axillary)    Resp 18    Ht 5\' 9"  (1.753 m)    Wt 51.1 kg Comment: bed scale. repeated three times   SpO2 96%    BMI 16.64 kg/m  Sedated, on the ventilator Cervical wound is clean and dry, no signs of infection No neurological changes

## 2020-07-02 NOTE — Progress Notes (Signed)
Pt placed on PSV 10/5 and is tolerating well at this time. RN aware. 

## 2020-07-02 NOTE — Anesthesia Postprocedure Evaluation (Signed)
Anesthesia Post Note  Patient: Wisdom Seybold  Procedure(s) Performed: Cervical Four Corpectomy (N/A Spine Cervical)     Patient location during evaluation: ICU Anesthesia Type: General Level of consciousness: sedated and patient remains intubated per anesthesia plan Pain management: pain level controlled Vital Signs Assessment: post-procedure vital signs reviewed and stable Respiratory status: patient on ventilator - see flowsheet for VS and patient remains intubated per anesthesia plan Cardiovascular status: blood pressure returned to baseline and stable Postop Assessment: no apparent nausea or vomiting Anesthetic complications: no Comments: Remains critically ill   No complications documented.  Last Vitals:  Vitals:   07/02/20 1400 07/02/20 1500  BP: 129/74 132/78  Pulse: (!) 113 (!) 105  Resp: 18 18  Temp:    SpO2: 96% 96%    Last Pain:  Vitals:   07/02/20 1200  TempSrc: Axillary  PainSc:                  Zain Lankford,E. Milano Rosevear

## 2020-07-02 NOTE — Anesthesia Postprocedure Evaluation (Signed)
Anesthesia Post Note  Patient: Tami Brown  Procedure(s) Performed: CLOSED REDUCTION NASAL FRACTURE (N/A Nose) OPEN REDUCTION INTERNAL FIXATION (ORIF) MANDIBULAR FRACTURE (N/A Mouth) TRACHEOSTOMY (N/A Neck)     Patient location during evaluation: SICU Anesthesia Type: General Level of consciousness: sedated and patient remains intubated per anesthesia plan Pain management: pain level controlled Vital Signs Assessment: post-procedure vital signs reviewed and stable Respiratory status: patient on ventilator - see flowsheet for VS and patient remains intubated per anesthesia plan Cardiovascular status: stable Anesthetic complications: no   No complications documented.  Last Vitals:  Vitals:   07/02/20 0600 07/02/20 0700  BP: (!) 154/92 118/69  Pulse: (!) 117 95  Resp: (!) 21 18  Temp:    SpO2: 96% 96%    Last Pain:  Vitals:   07/02/20 0400  TempSrc: Oral  PainSc:     LLE Motor Response: Non-purposeful movement (07/02/20 0730)   RLE Motor Response: Non-purposeful movement (07/02/20 0730)        Lewie Loron

## 2020-07-02 NOTE — Progress Notes (Incomplete)
{  [facility] IP ALL NUTRITION NOTES:[id number]} 
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
## 2020-07-02 NOTE — Progress Notes (Signed)
Pt placed back on full vent support due to HR in the 140's, and  increased WOB. Pt tolerated 4 hrs of PSV.

## 2020-07-03 LAB — GLUCOSE, CAPILLARY
Glucose-Capillary: 191 mg/dL — ABNORMAL HIGH (ref 70–99)
Glucose-Capillary: 193 mg/dL — ABNORMAL HIGH (ref 70–99)
Glucose-Capillary: 199 mg/dL — ABNORMAL HIGH (ref 70–99)
Glucose-Capillary: 228 mg/dL — ABNORMAL HIGH (ref 70–99)
Glucose-Capillary: 229 mg/dL — ABNORMAL HIGH (ref 70–99)
Glucose-Capillary: 251 mg/dL — ABNORMAL HIGH (ref 70–99)

## 2020-07-03 LAB — BASIC METABOLIC PANEL
Anion gap: 9 (ref 5–15)
BUN: 11 mg/dL (ref 6–20)
CO2: 22 mmol/L (ref 22–32)
Calcium: 8.4 mg/dL — ABNORMAL LOW (ref 8.9–10.3)
Chloride: 111 mmol/L (ref 98–111)
Creatinine, Ser: 0.65 mg/dL (ref 0.44–1.00)
GFR, Estimated: >60 mL/min (ref >60)
Glucose, Bld: 226 mg/dL — ABNORMAL HIGH (ref 70–99)
Potassium: 4.1 mmol/L (ref 3.5–5.1)
Sodium: 142 mmol/L (ref 135–145)

## 2020-07-03 LAB — CBC
HCT: 34.4 % — ABNORMAL LOW (ref 36.0–46.0)
Hemoglobin: 11.7 g/dL — ABNORMAL LOW (ref 12.0–15.0)
MCH: 31.8 pg (ref 26.0–34.0)
MCHC: 34 g/dL (ref 30.0–36.0)
MCV: 93.5 fL (ref 80.0–100.0)
Platelets: 400 10*3/uL (ref 150–400)
RBC: 3.68 MIL/uL — ABNORMAL LOW (ref 3.87–5.11)
RDW: 13.6 % (ref 11.5–15.5)
WBC: 27.3 10*3/uL — ABNORMAL HIGH (ref 4.0–10.5)
nRBC: 0 % (ref 0.0–0.2)

## 2020-07-03 LAB — MAGNESIUM: Magnesium: 2.1 mg/dL (ref 1.7–2.4)

## 2020-07-03 LAB — PHOSPHORUS: Phosphorus: 2.7 mg/dL (ref 2.5–4.6)

## 2020-07-03 MED ORDER — MORPHINE SULFATE (PF) 2 MG/ML IV SOLN
2.0000 mg | INTRAVENOUS | Status: DC | PRN
  Administered 2020-07-04 – 2020-07-08 (×7): 2 mg via INTRAVENOUS
  Filled 2020-07-03 (×7): qty 1
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
## 2020-07-03 NOTE — Progress Notes (Signed)
Pt placed back on full vent support due to HR in the 140's, and increased WOB. Pt. Tolerated 6 hrs of PSV. RN aware.
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
## 2020-07-03 NOTE — Progress Notes (Signed)
Pt placed on PSV 10/5 and is tolerating well at this time. RN aware. RT to continue to monitor. 

## 2020-07-03 NOTE — Progress Notes (Signed)
NEUROSURGERY PROGRESS NOTE  No acute issues overnight. Neurologically she is the same. Still sedated and on the ventilator. Wound CDI. No new nsgy recommendations.  Temp:  [99.4 F (37.4 C)-100.2 F (37.9 C)] 99.4 F (37.4 C) (10/30 0400) Pulse Rate:  [100-145] 100 (10/30 0700) Resp:  [17-34] 18 (10/30 0700) BP: (125-151)/(67-96) 125/67 (10/30 0700) SpO2:  [94 %-98 %] 95 % (10/30 0700) FiO2 (%):  [30 %] 30 % (10/30 0214)  Sherryl Manges, NP 07/03/2020 7:33 AM
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
## 2020-07-04 LAB — BASIC METABOLIC PANEL
Anion gap: 9 (ref 5–15)
BUN: 16 mg/dL (ref 6–20)
CO2: 23 mmol/L (ref 22–32)
Calcium: 9 mg/dL (ref 8.9–10.3)
Chloride: 114 mmol/L — ABNORMAL HIGH (ref 98–111)
Creatinine, Ser: 0.55 mg/dL (ref 0.44–1.00)
GFR, Estimated: >60 mL/min (ref >60)
Glucose, Bld: 288 mg/dL — ABNORMAL HIGH (ref 70–99)
Potassium: 4.5 mmol/L (ref 3.5–5.1)
Sodium: 146 mmol/L — ABNORMAL HIGH (ref 135–145)

## 2020-07-04 LAB — CBC
HCT: 37.6 % (ref 36.0–46.0)
Hemoglobin: 12.3 g/dL (ref 12.0–15.0)
MCH: 31.4 pg (ref 26.0–34.0)
MCHC: 32.7 g/dL (ref 30.0–36.0)
MCV: 95.9 fL (ref 80.0–100.0)
Platelets: 431 10*3/uL — ABNORMAL HIGH (ref 150–400)
RBC: 3.92 MIL/uL (ref 3.87–5.11)
RDW: 14 % (ref 11.5–15.5)
WBC: 33.7 10*3/uL — ABNORMAL HIGH (ref 4.0–10.5)
nRBC: 0 % (ref 0.0–0.2)

## 2020-07-04 LAB — GLUCOSE, CAPILLARY
Glucose-Capillary: 164 mg/dL — ABNORMAL HIGH (ref 70–99)
Glucose-Capillary: 238 mg/dL — ABNORMAL HIGH (ref 70–99)
Glucose-Capillary: 242 mg/dL — ABNORMAL HIGH (ref 70–99)
Glucose-Capillary: 251 mg/dL — ABNORMAL HIGH (ref 70–99)
Glucose-Capillary: 284 mg/dL — ABNORMAL HIGH (ref 70–99)
Glucose-Capillary: 291 mg/dL — ABNORMAL HIGH (ref 70–99)

## 2020-07-04 LAB — MAGNESIUM: Magnesium: 2.2 mg/dL (ref 1.7–2.4)

## 2020-07-04 LAB — PHOSPHORUS: Phosphorus: 3.3 mg/dL (ref 2.5–4.6)

## 2020-07-04 MED ORDER — POLYETHYLENE GLYCOL 3350 17 G PO PACK
17.0000 g | PACK | Freq: Every day | ORAL | Status: DC | PRN
  Administered 2020-07-04 – 2020-07-05 (×2): 17 g
  Filled 2020-07-04 (×2): qty 1

## 2020-07-04 MED ORDER — INSULIN ASPART 100 UNIT/ML ~~LOC~~ SOLN
0.0000 [IU] | SUBCUTANEOUS | Status: DC
  Administered 2020-07-04 (×3): 5 [IU] via SUBCUTANEOUS
  Administered 2020-07-05 (×2): 2 [IU] via SUBCUTANEOUS
  Administered 2020-07-05 (×2): 3 [IU] via SUBCUTANEOUS

## 2020-07-04 NOTE — Progress Notes (Signed)
Subjective: Patient reports Remains intubated and sedated  Objective: Vital signs in last 24 hours: Temp:  [97.3 F (36.3 C)-100.3 F (37.9 C)] 99.6 F (37.6 C) (10/31 0800) Pulse Rate:  [100-137] 100 (10/31 0800) Resp:  [18-36] 20 (10/31 0800) BP: (119-156)/(65-99) 147/89 (10/31 0800) SpO2:  [93 %-99 %] 96 % (10/31 0800) FiO2 (%):  [30 %] 30 % (10/31 0726) Weight:  [65.6 kg] 65.6 kg (10/31 0402)  Intake/Output from previous day: 10/30 0701 - 10/31 0700 In: 946.1 [I.V.:145.8; NG/GT:600; IV Piggyback:200.3] Out: 1850 [Urine:1850] Intake/Output this shift: No intake/output data recorded.  Neurologically stable  Lab Results: Recent Labs    07/02/20 0223 07/03/20 0559  WBC 17.4* 27.3*  HGB 10.9* 11.7*  HCT 32.7* 34.4*  PLT 251 400   BMET Recent Labs    07/02/20 0223 07/03/20 0559  NA 142 142  K 3.7 4.1  CL 111 111  CO2 21* 22  GLUCOSE 166* 226*  BUN 15 11  CREATININE 0.71 0.65  CALCIUM 8.3* 8.4*    Studies/Results: No results found.  Assessment/Plan: Status post corpectomy appears neurologically at baseline  LOS: 9 days     Mariam Dollar 07/04/2020, 8:56 AM

## 2020-07-04 NOTE — Progress Notes (Signed)
Inpatient Rehab Admissions Coordinator Note:   Per Pt/OT recommendations, pt was screened for CIR candidacy by Wolfgang Phoenix, MS, CCC-SLP.  At this time we are not recommending an inpatient rehab consult.  Will continue to follow from a distance.  Please contact me with questions.    Wolfgang Phoenix, MS, CCC-SLP Admissions Coordinator [phone] 07/04/20 1:34 PM
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
## 2020-07-04 NOTE — Progress Notes (Signed)
Paged Trauma team regarding POC BG 251. Requested insulin coverage for Q4H BG checks. Will await orders and continue to monitor.

## 2020-07-05 ENCOUNTER — Inpatient Hospital Stay (HOSPITAL_COMMUNITY): Payer: Medicaid - Out of State

## 2020-07-05 LAB — GLUCOSE, CAPILLARY
Glucose-Capillary: 195 mg/dL — ABNORMAL HIGH (ref 70–99)
Glucose-Capillary: 225 mg/dL — ABNORMAL HIGH (ref 70–99)
Glucose-Capillary: 233 mg/dL — ABNORMAL HIGH (ref 70–99)
Glucose-Capillary: 250 mg/dL — ABNORMAL HIGH (ref 70–99)
Glucose-Capillary: 258 mg/dL — ABNORMAL HIGH (ref 70–99)
Glucose-Capillary: 259 mg/dL — ABNORMAL HIGH (ref 70–99)

## 2020-07-05 LAB — COMPREHENSIVE METABOLIC PANEL
ALT: 84 U/L — ABNORMAL HIGH (ref 0–44)
AST: 53 U/L — ABNORMAL HIGH (ref 15–41)
Albumin: 2.1 g/dL — ABNORMAL LOW (ref 3.5–5.0)
Alkaline Phosphatase: 103 U/L (ref 38–126)
Anion gap: 11 (ref 5–15)
BUN: 19 mg/dL (ref 6–20)
CO2: 25 mmol/L (ref 22–32)
Calcium: 9.1 mg/dL (ref 8.9–10.3)
Chloride: 109 mmol/L (ref 98–111)
Creatinine, Ser: 0.6 mg/dL (ref 0.44–1.00)
GFR, Estimated: >60 mL/min (ref >60)
Glucose, Bld: 261 mg/dL — ABNORMAL HIGH (ref 70–99)
Potassium: 4.3 mmol/L (ref 3.5–5.1)
Sodium: 145 mmol/L (ref 135–145)
Total Bilirubin: 0.3 mg/dL (ref 0.3–1.2)
Total Protein: 8.2 g/dL — ABNORMAL HIGH (ref 6.5–8.1)

## 2020-07-05 LAB — CBC
HCT: 35.2 % — ABNORMAL LOW (ref 36.0–46.0)
Hemoglobin: 11.7 g/dL — ABNORMAL LOW (ref 12.0–15.0)
MCH: 31.5 pg (ref 26.0–34.0)
MCHC: 33.2 g/dL (ref 30.0–36.0)
MCV: 94.6 fL (ref 80.0–100.0)
Platelets: 449 10*3/uL — ABNORMAL HIGH (ref 150–400)
RBC: 3.72 MIL/uL — ABNORMAL LOW (ref 3.87–5.11)
RDW: 14 % (ref 11.5–15.5)
WBC: 30.7 10*3/uL — ABNORMAL HIGH (ref 4.0–10.5)
nRBC: 0 % (ref 0.0–0.2)

## 2020-07-05 LAB — MAGNESIUM: Magnesium: 2.2 mg/dL (ref 1.7–2.4)

## 2020-07-05 MED ORDER — DOCUSATE SODIUM 50 MG/5ML PO LIQD
100.0000 mg | Freq: Two times a day (BID) | ORAL | Status: DC
  Administered 2020-07-05 – 2020-07-10 (×8): 100 mg
  Filled 2020-07-05 (×9): qty 10

## 2020-07-05 MED ORDER — INSULIN ASPART 100 UNIT/ML ~~LOC~~ SOLN
2.0000 [IU] | SUBCUTANEOUS | Status: DC
  Administered 2020-07-05 – 2020-08-21 (×196): 2 [IU] via SUBCUTANEOUS

## 2020-07-05 MED ORDER — POLYETHYLENE GLYCOL 3350 17 G PO PACK
17.0000 g | PACK | Freq: Every day | ORAL | Status: DC
  Administered 2020-07-06 – 2020-09-03 (×33): 17 g
  Filled 2020-07-05 (×39): qty 1

## 2020-07-05 MED ORDER — INSULIN ASPART 100 UNIT/ML ~~LOC~~ SOLN
0.0000 [IU] | SUBCUTANEOUS | Status: DC
  Administered 2020-07-05 (×2): 8 [IU] via SUBCUTANEOUS
  Administered 2020-07-06: 5 [IU] via SUBCUTANEOUS
  Administered 2020-07-06 (×2): 2 [IU] via SUBCUTANEOUS
  Administered 2020-07-06: 3 [IU] via SUBCUTANEOUS
  Administered 2020-07-06: 5 [IU] via SUBCUTANEOUS
  Administered 2020-07-06: 3 [IU] via SUBCUTANEOUS
  Administered 2020-07-07: 2 [IU] via SUBCUTANEOUS
  Administered 2020-07-07: 3 [IU] via SUBCUTANEOUS
  Administered 2020-07-07: 2 [IU] via SUBCUTANEOUS
  Administered 2020-07-07 (×2): 3 [IU] via SUBCUTANEOUS
  Administered 2020-07-07: 2 [IU] via SUBCUTANEOUS
  Administered 2020-07-08: 3 [IU] via SUBCUTANEOUS
  Administered 2020-07-08: 2 [IU] via SUBCUTANEOUS
  Administered 2020-07-08: 5 [IU] via SUBCUTANEOUS
  Administered 2020-07-08: 3 [IU] via SUBCUTANEOUS
  Administered 2020-07-08 – 2020-07-09 (×7): 2 [IU] via SUBCUTANEOUS
  Administered 2020-07-09 – 2020-07-10 (×2): 3 [IU] via SUBCUTANEOUS
  Administered 2020-07-10 (×2): 2 [IU] via SUBCUTANEOUS
  Administered 2020-07-10 (×2): 3 [IU] via SUBCUTANEOUS
  Administered 2020-07-11 – 2020-07-12 (×7): 2 [IU] via SUBCUTANEOUS
  Administered 2020-07-12: 3 [IU] via SUBCUTANEOUS
  Administered 2020-07-12 – 2020-08-20 (×58): 2 [IU] via SUBCUTANEOUS
  Administered 2020-08-20: 3 [IU] via SUBCUTANEOUS
  Administered 2020-08-21 – 2020-08-22 (×2): 2 [IU] via SUBCUTANEOUS

## 2020-07-05 MED ORDER — BISACODYL 10 MG RE SUPP: 10.0000 mg | Freq: Every day | RECTAL | Status: DC | PRN

## 2020-07-05 MED ORDER — BISACODYL 10 MG RE SUPP
10.0000 mg | Freq: Once | RECTAL | Status: AC
  Administered 2020-07-05: 10 mg via RECTAL
  Filled 2020-07-05: qty 1

## 2020-07-05 MED ORDER — FREE WATER
200.0000 mL | Status: DC
  Administered 2020-07-05 – 2020-07-22 (×101): 200 mL

## 2020-07-05 MED ORDER — SODIUM CHLORIDE 0.9 % IV SOLN
2.0000 g | Freq: Three times a day (TID) | INTRAVENOUS | Status: DC
  Administered 2020-07-05 – 2020-07-07 (×6): 2 g via INTRAVENOUS
  Filled 2020-07-05 (×6): qty 2
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
## 2020-07-05 NOTE — Progress Notes (Signed)
Inpatient Diabetes Program Recommendations  AACE/ADA: New Consensus Statement on Inpatient Glycemic Control (2015)  Target Ranges:  Prepandial:   less than 140 mg/dL      Peak postprandial:   less than 180 mg/dL (1-2 hours)      Critically ill patients:  140 - 180 mg/dL   Lab Results  Component Value Date   GLUCAP 250 (H) 07/05/2020    Review of Glycemic Control Results for MASAE, LUKACS (MRN [id number]) as of 07/05/2020 09:21  Ref. Range 07/04/2020 07:58 07/04/2020 12:49 07/04/2020 17:38 07/04/2020 20:16 07/04/2020 23:54 07/05/2020 04:01 07/05/2020 08:14  Glucose-Capillary Latest Ref Range: 70 - 99 mg/dL 433 (H) 295 (H) 188 (H) 251 (H) 164 (H) 195 (H) 250 (H)   Diabetes history: none  Current orders for Inpatient glycemic control:  Novolog 0-9 units Q4 hours  Inpatient Diabetes Program Recommendations:    - Consider Novolog 2 units Q4 hours Tube Feed Coverage.  Thanks,  Christena Deem RN, MSN, BC-ADM Inpatient Diabetes Coordinator Team Pager [phone] (8a-5p)
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
## 2020-07-06 LAB — CBC
HCT: 33.5 % — ABNORMAL LOW (ref 36.0–46.0)
Hemoglobin: 11.1 g/dL — ABNORMAL LOW (ref 12.0–15.0)
MCH: 31.9 pg (ref 26.0–34.0)
MCHC: 33.1 g/dL (ref 30.0–36.0)
MCV: 96.3 fL (ref 80.0–100.0)
Platelets: 464 10*3/uL — ABNORMAL HIGH (ref 150–400)
RBC: 3.48 MIL/uL — ABNORMAL LOW (ref 3.87–5.11)
RDW: 14.2 % (ref 11.5–15.5)
WBC: 23.7 10*3/uL — ABNORMAL HIGH (ref 4.0–10.5)
nRBC: 0 % (ref 0.0–0.2)

## 2020-07-06 LAB — BASIC METABOLIC PANEL
Anion gap: 10 (ref 5–15)
BUN: 20 mg/dL (ref 6–20)
CO2: 24 mmol/L (ref 22–32)
Calcium: 8.9 mg/dL (ref 8.9–10.3)
Chloride: 109 mmol/L (ref 98–111)
Creatinine, Ser: 0.61 mg/dL (ref 0.44–1.00)
GFR, Estimated: >60 mL/min (ref >60)
Glucose, Bld: 199 mg/dL — ABNORMAL HIGH (ref 70–99)
Potassium: 4.3 mmol/L (ref 3.5–5.1)
Sodium: 143 mmol/L (ref 135–145)

## 2020-07-06 LAB — GLUCOSE, CAPILLARY
Glucose-Capillary: 197 mg/dL — ABNORMAL HIGH (ref 70–99)
Glucose-Capillary: 173 mg/dL — ABNORMAL HIGH (ref 70–99)
Glucose-Capillary: 194 mg/dL — ABNORMAL HIGH (ref 70–99)
Glucose-Capillary: 136 mg/dL — ABNORMAL HIGH (ref 70–99)
Glucose-Capillary: 151 mg/dL — ABNORMAL HIGH (ref 70–99)

## 2020-07-06 MED ORDER — METOPROLOL TARTRATE 25 MG/10 ML ORAL SUSPENSION
25.0000 mg | Freq: Two times a day (BID) | ORAL | Status: DC
  Administered 2020-07-06 (×2): 25 mg
  Filled 2020-07-06 (×2): qty 10
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
## 2020-07-06 NOTE — Consult Note (Signed)
WOC Nurse Follow up Consult Note: Patient receiving care in [facility] 4N31. C Collar has been removed. Reason for Consult: Neck wound Wound type: Unstageable MDRPI full thickness Pressure Injury POA: No Measurement: 1 cm x 0.6 cm x 0.4 cm  Wound bed: 75% red, 25% yellow Drainage (amount, consistency, odor) None Periwound: Intact Dressing procedure/placement/frequency: Continue previous orders: Apply small piece of Xeroform gauze to left side neck wound. Cover with 4 x 4 and foam dressings. Change daily  Monitor the wound area(s) for worsening of condition such as: Signs/symptoms of infection, increase in size, development of or worsening of odor, development of pain, or increased pain at the affected locations.   Notify the medical team if any of these develop.  Thank you for the consult. WOC nurse will follow weekly Please re-consult the WOC team if needed.  Renaldo Reel Katrinka Blazing, MSN, RN, CMSRN, Angus Seller, [facility] Wound Treatment Associate Pager [phone]

## 2020-07-06 NOTE — Progress Notes (Signed)
Patient ID: Tami Brown, female   DOB: 23-Apr-1996, 24 y.o.   MRN: [id number] BP 125/72   Pulse (!) 108   Temp 99.5 F (37.5 C) (Oral)   Resp (!) 23   Ht 5\' 9"  (1.753 m)   Wt 65.8 kg   SpO2 92%   BMI 21.42 kg/m  Moving purposefully, right greater than left Cervical wound is clean, dry, no signs of infection Slowly showing more awareness Perrl,  Normal cough

## 2020-07-07 LAB — CBC
HCT: 34.3 % — ABNORMAL LOW (ref 36.0–46.0)
Hemoglobin: 11 g/dL — ABNORMAL LOW (ref 12.0–15.0)
MCH: 31.2 pg (ref 26.0–34.0)
MCHC: 32.1 g/dL (ref 30.0–36.0)
MCV: 97.2 fL (ref 80.0–100.0)
Platelets: 336 10*3/uL (ref 150–400)
RBC: 3.53 MIL/uL — ABNORMAL LOW (ref 3.87–5.11)
RDW: 14.2 % (ref 11.5–15.5)
WBC: 19 10*3/uL — ABNORMAL HIGH (ref 4.0–10.5)
nRBC: 0 % (ref 0.0–0.2)

## 2020-07-07 LAB — BASIC METABOLIC PANEL
Anion gap: 12 (ref 5–15)
BUN: 20 mg/dL (ref 6–20)
CO2: 23 mmol/L (ref 22–32)
Calcium: 8.8 mg/dL — ABNORMAL LOW (ref 8.9–10.3)
Chloride: 110 mmol/L (ref 98–111)
Creatinine, Ser: 0.56 mg/dL (ref 0.44–1.00)
GFR, Estimated: >60 mL/min (ref >60)
Glucose, Bld: 151 mg/dL — ABNORMAL HIGH (ref 70–99)
Potassium: 3.9 mmol/L (ref 3.5–5.1)
Sodium: 145 mmol/L (ref 135–145)

## 2020-07-07 LAB — GLUCOSE, CAPILLARY
Glucose-Capillary: 132 mg/dL — ABNORMAL HIGH (ref 70–99)
Glucose-Capillary: 139 mg/dL — ABNORMAL HIGH (ref 70–99)
Glucose-Capillary: 148 mg/dL — ABNORMAL HIGH (ref 70–99)
Glucose-Capillary: 161 mg/dL — ABNORMAL HIGH (ref 70–99)
Glucose-Capillary: 189 mg/dL — ABNORMAL HIGH (ref 70–99)
Glucose-Capillary: 197 mg/dL — ABNORMAL HIGH (ref 70–99)
Glucose-Capillary: 207 mg/dL — ABNORMAL HIGH (ref 70–99)

## 2020-07-07 LAB — CULTURE, RESPIRATORY W GRAM STAIN

## 2020-07-07 MED ORDER — METOPROLOL TARTRATE 25 MG/10 ML ORAL SUSPENSION
50.0000 mg | Freq: Two times a day (BID) | ORAL | Status: DC
  Administered 2020-07-07 – 2020-07-20 (×27): 50 mg
  Filled 2020-07-07 (×27): qty 20

## 2020-07-07 MED ORDER — CEFAZOLIN SODIUM-DEXTROSE 2-4 GM/100ML-% IV SOLN
2.0000 g | Freq: Three times a day (TID) | INTRAVENOUS | Status: AC
  Administered 2020-07-07 – 2020-07-11 (×14): 2 g via INTRAVENOUS
  Filled 2020-07-07 (×14): qty 100
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
## 2020-07-07 NOTE — Progress Notes (Signed)
6 Days Post-Op  Subjective: 24 year old female status post open reduction internal fixation of mandibular fracture and closed reduction of nasal fracture with Dr. Ulice Bold on 07/01/2020.  Patient sedated, PT at bedside.  Objective: Vital signs in last 24 hours: Temp:  [98.6 F (37 C)-100.1 F (37.8 C)] 99.9 F (37.7 C) (11/03 0800) Pulse Rate:  [96-142] 125 (11/03 0800) Resp:  [18-30] 23 (11/03 0800) BP: (114-159)/(66-112) 159/100 (11/03 0800) SpO2:  [89 %-100 %] 99 % (11/03 0800) FiO2 (%):  [30 %] 30 % (11/03 0800) Weight:  [66.6 kg] 66.6 kg (11/03 0500) Last BM Date: 07/05/20  Intake/Output from previous day: 11/02 0701 - 11/03 0700 In: 2564.9 [I.V.:224.9; NG/GT:2040; IV Piggyback:300] Out: 1600 [Urine:1600] Intake/Output this shift: Total I/O In: 70 [I.V.:10; NG/GT:60] Out: -   General appearance: sedated, resting in bed, PT at bedside Nose: nares normal, denver splint in place Neck: trach tube in place Extremities: extremities normal, atraumatic, no cyanosis or edema Pulses: 2+ and symmetric Incision/Wound: oral incision in tact  Lab Results:  CBC Latest Ref Rng & Units 07/07/2020 07/06/2020 07/05/2020  WBC 4.0 - 10.5 K/uL 19.0(H) 23.7(H) 30.7(H)  Hemoglobin 12.0 - 15.0 g/dL 11.0(L) 11.1(L) 11.7(L)  Hematocrit 36 - 46 % 34.3(L) 33.5(L) 35.2(L)  Platelets 150 - 400 K/uL 336 464(H) 449(H)    BMET Recent Labs    07/06/20 0437 07/07/20 0222  NA 143 145  K 4.3 3.9  CL 109 110  CO2 24 23  GLUCOSE 199* 151*  BUN 20 20  CREATININE 0.61 0.56  CALCIUM 8.9 8.8*   PT/INR No results for input(s): LABPROT, INR in the last 72 hours. ABG No results for input(s): PHART, HCO3 in the last 72 hours.  Invalid input(s): PCO2, PO2  Studies/Results: DG CHEST PORT 1 VIEW  Result Date: 07/05/2020 CLINICAL DATA:  Respiratory failure. Follow-up. EXAM: PORTABLE CHEST 1 VIEW COMPARISON:  07/01/2020 FINDINGS: Tracheostomy remains in place. Right lung remains clear. Persistent  infiltrate and volume loss in the left lower lobe, similar to the previous exam. No new finding. IMPRESSION: No change. Persistent infiltrate and volume loss in the left lower lobe. Electronically Signed   By: Paulina Fusi M.D.   On: 07/05/2020 11:35    Anti-infectives: Anti-infectives (From admission, onward)   Start     Dose/Rate Route Frequency Ordered Stop   07/05/20 1200  ceFEPIme (MAXIPIME) 2 g in sodium chloride 0.9 % 100 mL IVPB        2 g 200 mL/hr over 30 Minutes Intravenous Every 8 hours 07/05/20 1058     06/25/20 0215  Ampicillin-Sulbactam (UNASYN) 3 g in sodium chloride 0.9 % 100 mL IVPB        3 g 200 mL/hr over 30 Minutes Intravenous  Once 06/25/20 0148 06/25/20 0329      Assessment/Plan: s/p Procedure(s): CLOSED REDUCTION NASAL FRACTURE OPEN REDUCTION INTERNAL FIXATION (ORIF) MANDIBULAR FRACTURE  Patient has PEG tube. Once PEG tube is DC'd recommend starting with soft diet, progress as able.  Recommend leaving nasal splint in place for 2 weeks.  Will continue to follow, ORIF and nasal reduction appear stable.    LOS: 12 days    Leslee Home, PA-C 07/07/2020
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
## 2020-07-07 NOTE — Progress Notes (Signed)
Patient ID: Tami Brown, female   DOB: [birth year], 24 y.o.   MRN: [id number] I tried to call both of her parents but their phones are not taking calls.  Violeta Gelinas, MD, MPH, FACS Please use [email or web address] to contact on call provider
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
## 2020-07-07 NOTE — Consult Note (Signed)
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 Measures 3.2 cm x 3.2 cm 100% red.  Drainage (amount, consistency, odor) None Periwound: Intact Dressing procedure/placement/frequency: Apply small piece of xeroform gauze and cover with foam dressing. Change daily.   Monitor the wound area(s) for worsening of condition such as: Signs/symptoms of infection, increase in size, development of or worsening of odor, development of pain, or increased pain at the affected locations.   Notify the medical team if any of these develop.  Thank you for the consult. WOC nurse is currently following this patient weekly.   Please re-consult the WOC team if needed.  Renaldo Reel Katrinka Blazing, MSN, RN, CMSRN, Angus Seller, [facility] Wound Treatment Associate Pager [phone]

## 2020-07-08 LAB — CBC
HCT: 34.8 % — ABNORMAL LOW (ref 36.0–46.0)
Hemoglobin: 11.2 g/dL — ABNORMAL LOW (ref 12.0–15.0)
MCH: 31.4 pg (ref 26.0–34.0)
MCHC: 32.2 g/dL (ref 30.0–36.0)
MCV: 97.5 fL (ref 80.0–100.0)
Platelets: 548 10*3/uL — ABNORMAL HIGH (ref 150–400)
RBC: 3.57 MIL/uL — ABNORMAL LOW (ref 3.87–5.11)
RDW: 13.9 % (ref 11.5–15.5)
WBC: 19.2 10*3/uL — ABNORMAL HIGH (ref 4.0–10.5)
nRBC: 0 % (ref 0.0–0.2)

## 2020-07-08 LAB — GLUCOSE, CAPILLARY
Glucose-Capillary: 131 mg/dL — ABNORMAL HIGH (ref 70–99)
Glucose-Capillary: 144 mg/dL — ABNORMAL HIGH (ref 70–99)
Glucose-Capillary: 145 mg/dL — ABNORMAL HIGH (ref 70–99)
Glucose-Capillary: 153 mg/dL — ABNORMAL HIGH (ref 70–99)
Glucose-Capillary: 154 mg/dL — ABNORMAL HIGH (ref 70–99)
Glucose-Capillary: 168 mg/dL — ABNORMAL HIGH (ref 70–99)

## 2020-07-08 LAB — BASIC METABOLIC PANEL
Anion gap: 9 (ref 5–15)
BUN: 19 mg/dL (ref 6–20)
CO2: 24 mmol/L (ref 22–32)
Calcium: 8.8 mg/dL — ABNORMAL LOW (ref 8.9–10.3)
Chloride: 109 mmol/L (ref 98–111)
Creatinine, Ser: 0.53 mg/dL (ref 0.44–1.00)
GFR, Estimated: >60 mL/min (ref >60)
Glucose, Bld: 180 mg/dL — ABNORMAL HIGH (ref 70–99)
Potassium: 4.1 mmol/L (ref 3.5–5.1)
Sodium: 142 mmol/L (ref 135–145)

## 2020-07-08 NOTE — Progress Notes (Signed)
Patient ID: Tami Brown, female   DOB: 1996/08/13, 24 y.o.   MRN: [id number] I called his father's number and his phone is not taking calls at this time.  Violeta Gelinas, MD, MPH, FACS Please use [email or web address] to contact on call provider

## 2020-07-08 NOTE — Progress Notes (Signed)
RT NOTE: RTx2 removed all 4 trach sutures per MD order. No complications. RT will continue to monitor.
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
## 2020-07-08 NOTE — Progress Notes (Signed)
Patient ID: Tami Brown, female   DOB: [birth year], 24 y.o.   MRN: [id number] BP 118/80   Pulse (!) 129   Temp 98.4 F (36.9 C) (Axillary)   Resp 18   Ht 5\' 9"  (1.753 m)   Wt 66.6 kg   SpO2 100%   BMI 21.68 kg/m  Will open eyes, not to command. Is not following commands Wounds are clean and dry.  No neurological change

## 2020-07-09 LAB — GLUCOSE, CAPILLARY
Glucose-Capillary: 149 mg/dL — ABNORMAL HIGH (ref 70–99)
Glucose-Capillary: 137 mg/dL — ABNORMAL HIGH (ref 70–99)
Glucose-Capillary: 145 mg/dL — ABNORMAL HIGH (ref 70–99)
Glucose-Capillary: 181 mg/dL — ABNORMAL HIGH (ref 70–99)
Glucose-Capillary: 132 mg/dL — ABNORMAL HIGH (ref 70–99)

## 2020-07-09 LAB — CBC
HCT: 35.1 % — ABNORMAL LOW (ref 36.0–46.0)
Hemoglobin: 11.4 g/dL — ABNORMAL LOW (ref 12.0–15.0)
MCH: 32.1 pg (ref 26.0–34.0)
MCHC: 32.5 g/dL (ref 30.0–36.0)
MCV: 98.9 fL (ref 80.0–100.0)
Platelets: 516 10*3/uL — ABNORMAL HIGH (ref 150–400)
RBC: 3.55 MIL/uL — ABNORMAL LOW (ref 3.87–5.11)
RDW: 14 % (ref 11.5–15.5)
WBC: 21.8 10*3/uL — ABNORMAL HIGH (ref 4.0–10.5)
nRBC: 0 % (ref 0.0–0.2)

## 2020-07-09 LAB — BASIC METABOLIC PANEL
Anion gap: 10 (ref 5–15)
BUN: 19 mg/dL (ref 6–20)
CO2: 23 mmol/L (ref 22–32)
Calcium: 8.9 mg/dL (ref 8.9–10.3)
Chloride: 108 mmol/L (ref 98–111)
Creatinine, Ser: 0.62 mg/dL (ref 0.44–1.00)
GFR, Estimated: >60 mL/min (ref >60)
Glucose, Bld: 197 mg/dL — ABNORMAL HIGH (ref 70–99)
Potassium: 4.2 mmol/L (ref 3.5–5.1)
Sodium: 141 mmol/L (ref 135–145)

## 2020-07-09 MED ORDER — PIVOT 1.5 CAL PO LIQD
1000.0000 mL | ORAL | Status: DC
  Administered 2020-07-09 – 2020-07-21 (×13): 1000 mL
  Filled 2020-07-09 (×10): qty 1000
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
## 2020-07-10 LAB — GLUCOSE, CAPILLARY
Glucose-Capillary: 108 mg/dL — ABNORMAL HIGH (ref 70–99)
Glucose-Capillary: 114 mg/dL — ABNORMAL HIGH (ref 70–99)
Glucose-Capillary: 131 mg/dL — ABNORMAL HIGH (ref 70–99)
Glucose-Capillary: 138 mg/dL — ABNORMAL HIGH (ref 70–99)
Glucose-Capillary: 153 mg/dL — ABNORMAL HIGH (ref 70–99)
Glucose-Capillary: 168 mg/dL — ABNORMAL HIGH (ref 70–99)
Glucose-Capillary: 174 mg/dL — ABNORMAL HIGH (ref 70–99)

## 2020-07-10 LAB — CBC
HCT: 35.1 % — ABNORMAL LOW (ref 36.0–46.0)
Hemoglobin: 11.2 g/dL — ABNORMAL LOW (ref 12.0–15.0)
MCH: 31.5 pg (ref 26.0–34.0)
MCHC: 31.9 g/dL (ref 30.0–36.0)
MCV: 98.9 fL (ref 80.0–100.0)
Platelets: 466 10*3/uL — ABNORMAL HIGH (ref 150–400)
RBC: 3.55 MIL/uL — ABNORMAL LOW (ref 3.87–5.11)
RDW: 13.9 % (ref 11.5–15.5)
WBC: 16.8 10*3/uL — ABNORMAL HIGH (ref 4.0–10.5)
nRBC: 0 % (ref 0.0–0.2)

## 2020-07-10 LAB — BASIC METABOLIC PANEL
Anion gap: 11 (ref 5–15)
BUN: 19 mg/dL (ref 6–20)
CO2: 22 mmol/L (ref 22–32)
Calcium: 8.8 mg/dL — ABNORMAL LOW (ref 8.9–10.3)
Chloride: 107 mmol/L (ref 98–111)
Creatinine, Ser: 0.56 mg/dL (ref 0.44–1.00)
GFR, Estimated: >60 mL/min (ref >60)
Glucose, Bld: 132 mg/dL — ABNORMAL HIGH (ref 70–99)
Potassium: 4.3 mmol/L (ref 3.5–5.1)
Sodium: 140 mmol/L (ref 135–145)

## 2020-07-10 MED ORDER — GLYCOPYRROLATE 0.2 MG/ML IJ SOLN
0.1000 mg | Freq: Once | INTRAMUSCULAR | Status: AC
  Administered 2020-07-10: 0.1 mg via INTRAVENOUS
  Filled 2020-07-10: qty 1
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
## 2020-07-11 LAB — CBC
HCT: 32 % — ABNORMAL LOW (ref 36.0–46.0)
Hemoglobin: 10.3 g/dL — ABNORMAL LOW (ref 12.0–15.0)
MCH: 31.6 pg (ref 26.0–34.0)
MCHC: 32.2 g/dL (ref 30.0–36.0)
MCV: 98.2 fL (ref 80.0–100.0)
Platelets: 580 10*3/uL — ABNORMAL HIGH (ref 150–400)
RBC: 3.26 MIL/uL — ABNORMAL LOW (ref 3.87–5.11)
RDW: 13.9 % (ref 11.5–15.5)
WBC: 14.3 10*3/uL — ABNORMAL HIGH (ref 4.0–10.5)
nRBC: 0 % (ref 0.0–0.2)

## 2020-07-11 LAB — GLUCOSE, CAPILLARY
Glucose-Capillary: 112 mg/dL — ABNORMAL HIGH (ref 70–99)
Glucose-Capillary: 127 mg/dL — ABNORMAL HIGH (ref 70–99)
Glucose-Capillary: 129 mg/dL — ABNORMAL HIGH (ref 70–99)
Glucose-Capillary: 131 mg/dL — ABNORMAL HIGH (ref 70–99)
Glucose-Capillary: 137 mg/dL — ABNORMAL HIGH (ref 70–99)
Glucose-Capillary: 183 mg/dL — ABNORMAL HIGH (ref 70–99)

## 2020-07-11 LAB — BASIC METABOLIC PANEL
Anion gap: 8 (ref 5–15)
BUN: 19 mg/dL (ref 6–20)
CO2: 25 mmol/L (ref 22–32)
Calcium: 8.7 mg/dL — ABNORMAL LOW (ref 8.9–10.3)
Chloride: 108 mmol/L (ref 98–111)
Creatinine, Ser: 0.56 mg/dL (ref 0.44–1.00)
GFR, Estimated: >60 mL/min (ref >60)
Glucose, Bld: 146 mg/dL — ABNORMAL HIGH (ref 70–99)
Potassium: 4.1 mmol/L (ref 3.5–5.1)
Sodium: 141 mmol/L (ref 135–145)
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
## 2020-07-12 LAB — GLUCOSE, CAPILLARY
Glucose-Capillary: 108 mg/dL — ABNORMAL HIGH (ref 70–99)
Glucose-Capillary: 115 mg/dL — ABNORMAL HIGH (ref 70–99)
Glucose-Capillary: 134 mg/dL — ABNORMAL HIGH (ref 70–99)
Glucose-Capillary: 136 mg/dL — ABNORMAL HIGH (ref 70–99)
Glucose-Capillary: 145 mg/dL — ABNORMAL HIGH (ref 70–99)
Glucose-Capillary: 145 mg/dL — ABNORMAL HIGH (ref 70–99)

## 2020-07-12 LAB — BASIC METABOLIC PANEL
Anion gap: 9 (ref 5–15)
BUN: 19 mg/dL (ref 6–20)
CO2: 25 mmol/L (ref 22–32)
Calcium: 8.9 mg/dL (ref 8.9–10.3)
Chloride: 106 mmol/L (ref 98–111)
Creatinine, Ser: 0.53 mg/dL (ref 0.44–1.00)
GFR, Estimated: >60 mL/min (ref >60)
Glucose, Bld: 145 mg/dL — ABNORMAL HIGH (ref 70–99)
Potassium: 4.2 mmol/L (ref 3.5–5.1)
Sodium: 140 mmol/L (ref 135–145)

## 2020-07-12 LAB — CBC
HCT: 33.7 % — ABNORMAL LOW (ref 36.0–46.0)
Hemoglobin: 10.8 g/dL — ABNORMAL LOW (ref 12.0–15.0)
MCH: 31.7 pg (ref 26.0–34.0)
MCHC: 32 g/dL (ref 30.0–36.0)
MCV: 98.8 fL (ref 80.0–100.0)
Platelets: 384 10*3/uL (ref 150–400)
RBC: 3.41 MIL/uL — ABNORMAL LOW (ref 3.87–5.11)
RDW: 13.8 % (ref 11.5–15.5)
WBC: 12.8 10*3/uL — ABNORMAL HIGH (ref 4.0–10.5)
nRBC: 0 % (ref 0.0–0.2)

## 2020-07-12 MED ORDER — GLYCOPYRROLATE 0.2 MG/ML IJ SOLN
0.1000 mg | Freq: Three times a day (TID) | INTRAMUSCULAR | Status: DC
  Administered 2020-07-12 – 2020-08-09 (×86): 0.1 mg via INTRAVENOUS
  Filled 2020-07-12 (×88): qty 1
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
## 2020-07-12 NOTE — Progress Notes (Signed)
Patient ID: Tami Brown, female   DOB: 1996-02-18, 24 y.o.   MRN: [id number] BP (!) 140/92   Pulse (!) 140   Temp 99.5 F (37.5 C) (Axillary)   Resp 16   Ht 5\' 9"  (1.753 m)   Wt 60 kg   SpO2 99%   BMI 19.53 kg/m  Will open eyes, not following commands Will withdraw left lower extremity No neurosurgical change
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
## 2020-07-13 DIAGNOSIS — S069X4S Unspecified intracranial injury with loss of consciousness of 6 hours to 24 hours, sequela: Secondary | ICD-10-CM

## 2020-07-13 LAB — BASIC METABOLIC PANEL
Anion gap: 8 (ref 5–15)
BUN: 20 mg/dL (ref 6–20)
CO2: 24 mmol/L (ref 22–32)
Calcium: 8.8 mg/dL — ABNORMAL LOW (ref 8.9–10.3)
Chloride: 107 mmol/L (ref 98–111)
Creatinine, Ser: 0.54 mg/dL (ref 0.44–1.00)
GFR, Estimated: >60 mL/min (ref >60)
Glucose, Bld: 140 mg/dL — ABNORMAL HIGH (ref 70–99)
Potassium: 3.9 mmol/L (ref 3.5–5.1)
Sodium: 139 mmol/L (ref 135–145)

## 2020-07-13 LAB — MAGNESIUM: Magnesium: 1.9 mg/dL (ref 1.7–2.4)

## 2020-07-13 LAB — GLUCOSE, CAPILLARY
Glucose-Capillary: 101 mg/dL — ABNORMAL HIGH (ref 70–99)
Glucose-Capillary: 114 mg/dL — ABNORMAL HIGH (ref 70–99)
Glucose-Capillary: 123 mg/dL — ABNORMAL HIGH (ref 70–99)
Glucose-Capillary: 129 mg/dL — ABNORMAL HIGH (ref 70–99)
Glucose-Capillary: 139 mg/dL — ABNORMAL HIGH (ref 70–99)
Glucose-Capillary: 140 mg/dL — ABNORMAL HIGH (ref 70–99)

## 2020-07-13 LAB — CBC
HCT: 32 % — ABNORMAL LOW (ref 36.0–46.0)
Hemoglobin: 10.3 g/dL — ABNORMAL LOW (ref 12.0–15.0)
MCH: 32.1 pg (ref 26.0–34.0)
MCHC: 32.2 g/dL (ref 30.0–36.0)
MCV: 99.7 fL (ref 80.0–100.0)
Platelets: 492 10*3/uL — ABNORMAL HIGH (ref 150–400)
RBC: 3.21 MIL/uL — ABNORMAL LOW (ref 3.87–5.11)
RDW: 13.7 % (ref 11.5–15.5)
WBC: 13 10*3/uL — ABNORMAL HIGH (ref 4.0–10.5)
nRBC: 0 % (ref 0.0–0.2)

## 2020-07-13 LAB — PHOSPHORUS: Phosphorus: 4.2 mg/dL (ref 2.5–4.6)

## 2020-07-13 MED ORDER — METHOCARBAMOL 750 MG PO TABS
750.0000 mg | ORAL_TABLET | Freq: Three times a day (TID) | ORAL | Status: DC
  Administered 2020-07-13 – 2020-09-03 (×154): 750 mg
  Filled 2020-07-13 (×6): qty 1
  Filled 2020-07-13: qty 2
  Filled 2020-07-13 (×25): qty 1
  Filled 2020-07-13: qty 2
  Filled 2020-07-13 (×2): qty 1
  Filled 2020-07-13: qty 2
  Filled 2020-07-13 (×16): qty 1
  Filled 2020-07-13: qty 2
  Filled 2020-07-13 (×18): qty 1
  Filled 2020-07-13: qty 2
  Filled 2020-07-13 (×10): qty 1
  Filled 2020-07-13: qty 2
  Filled 2020-07-13 (×2): qty 1
  Filled 2020-07-13: qty 2
  Filled 2020-07-13: qty 1
  Filled 2020-07-13: qty 2
  Filled 2020-07-13: qty 1
  Filled 2020-07-13: qty 2
  Filled 2020-07-13 (×5): qty 1
  Filled 2020-07-13: qty 2
  Filled 2020-07-13 (×20): qty 1
  Filled 2020-07-13: qty 2
  Filled 2020-07-13 (×2): qty 1
  Filled 2020-07-13: qty 2
  Filled 2020-07-13 (×2): qty 1
  Filled 2020-07-13: qty 2
  Filled 2020-07-13 (×4): qty 1
  Filled 2020-07-13: qty 2
  Filled 2020-07-13: qty 1
  Filled 2020-07-13: qty 2
  Filled 2020-07-13 (×9): qty 1
  Filled 2020-07-13: qty 2
  Filled 2020-07-13 (×4): qty 1
  Filled 2020-07-13 (×2): qty 2
  Filled 2020-07-13 (×11): qty 1

## 2020-07-13 MED ORDER — AMANTADINE HCL 100 MG PO CAPS
100.0000 mg | ORAL_CAPSULE | Freq: Two times a day (BID) | ORAL | Status: DC
  Administered 2020-07-14 – 2020-07-18 (×10): 100 mg
  Filled 2020-07-13 (×12): qty 1
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
## 2020-07-13 NOTE — Progress Notes (Signed)
Patient ID: Tami Brown, female   DOB: 1996-06-03, 24 y.o.   MRN: [id number] I called her mother and updated her on her progress and the plan of care.  Tami Gelinas, MD, MPH, FACS Please use [email or web address] to contact on call provider
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
## 2020-07-13 NOTE — Progress Notes (Signed)
Patient ID: Tami Brown, female   DOB: [birth year], 24 y.o.   MRN: [id number] I tried to call her father but his phone is still not taking calls.  Violeta Gelinas, MD, MPH, FACS Please use [email or web address] to contact on call provider

## 2020-07-13 NOTE — Progress Notes (Signed)
Patient ID: Tami Brown, female   DOB: 20-Dec-1995, 24 y.o.   MRN: [id number] BP (!) 111/93   Pulse (!) 124   Temp 98.1 F (36.7 C) (Axillary)   Resp 18   Ht 5\' 9"  (1.753 m)   Wt 52.8 kg   SpO2 99%   BMI 17.19 kg/m  Not following commands Opens eyes No neurological change Perrl, corneals, cough Will withdraw left lower extremity

## 2020-07-14 LAB — BASIC METABOLIC PANEL
Anion gap: 8 (ref 5–15)
BUN: 22 mg/dL — ABNORMAL HIGH (ref 6–20)
CO2: 24 mmol/L (ref 22–32)
Calcium: 8.8 mg/dL — ABNORMAL LOW (ref 8.9–10.3)
Chloride: 107 mmol/L (ref 98–111)
Creatinine, Ser: 0.54 mg/dL (ref 0.44–1.00)
GFR, Estimated: >60 mL/min (ref >60)
Glucose, Bld: 142 mg/dL — ABNORMAL HIGH (ref 70–99)
Potassium: 3.9 mmol/L (ref 3.5–5.1)
Sodium: 139 mmol/L (ref 135–145)

## 2020-07-14 LAB — CBC
HCT: 31.7 % — ABNORMAL LOW (ref 36.0–46.0)
Hemoglobin: 10.2 g/dL — ABNORMAL LOW (ref 12.0–15.0)
MCH: 31.9 pg (ref 26.0–34.0)
MCHC: 32.2 g/dL (ref 30.0–36.0)
MCV: 99.1 fL (ref 80.0–100.0)
Platelets: 281 10*3/uL (ref 150–400)
RBC: 3.2 MIL/uL — ABNORMAL LOW (ref 3.87–5.11)
RDW: 13.8 % (ref 11.5–15.5)
WBC: 12.1 10*3/uL — ABNORMAL HIGH (ref 4.0–10.5)
nRBC: 0 % (ref 0.0–0.2)

## 2020-07-14 LAB — GLUCOSE, CAPILLARY
Glucose-Capillary: 122 mg/dL — ABNORMAL HIGH (ref 70–99)
Glucose-Capillary: 122 mg/dL — ABNORMAL HIGH (ref 70–99)
Glucose-Capillary: 124 mg/dL — ABNORMAL HIGH (ref 70–99)
Glucose-Capillary: 132 mg/dL — ABNORMAL HIGH (ref 70–99)
Glucose-Capillary: 137 mg/dL — ABNORMAL HIGH (ref 70–99)
Glucose-Capillary: 143 mg/dL — ABNORMAL HIGH (ref 70–99)

## 2020-07-14 LAB — PHOSPHORUS: Phosphorus: 3.9 mg/dL (ref 2.5–4.6)

## 2020-07-14 LAB — MAGNESIUM: Magnesium: 2.1 mg/dL (ref 1.7–2.4)
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
## 2020-07-14 NOTE — Progress Notes (Signed)
Patient ID: Tami Brown, female   DOB: 1996/02/16, 24 y.o.   MRN: [id number] BP 111/65   Pulse (!) 116   Temp 99.4 F (37.4 C) (Axillary)   Resp 15   Ht 5\' 9"  (1.753 m)   Wt 53.4 kg   SpO2 99%   BMI 17.39 kg/m  Not following commands No neurological change Wound is clean, dry
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
## 2020-07-14 NOTE — Progress Notes (Signed)
13 Days Post-Op  Subjective: Patient is 13 days post op from ORIF of mandibular fx and closed reduction of nasal fx with Dr. Ulice Bold.  Patient somnolent, not responsive to verbal stimuli on exam.  Objective: Vital signs in last 24 hours: Temp:  [98.1 F (36.7 C)-100.3 F (37.9 C)] 100.3 F (37.9 C) (11/10 0800) Pulse Rate:  [88-132] 106 (11/10 0800) Resp:  [9-24] 18 (11/10 0800) BP: (108-135)/(56-100) 108/64 (11/10 0800) SpO2:  [99 %-100 %] 99 % (11/10 0800) FiO2 (%):  [30 %] 30 % (11/10 0732) Weight:  [53.4 kg] 53.4 kg (11/10 0500) Last BM Date: 07/13/20  Intake/Output from previous day: 11/09 0701 - 11/10 0700 In: 3129.9 [I.V.:239.9; NG/GT:2890] Out: 1350 [Urine:1350] Intake/Output this shift: Total I/O In: 275 [I.V.:10; NG/GT:265] Out: -   General appearance: no distress and comfortable HEENT: Intubated, unable to assess occlusion. Appears stables Nose: Nares normal, nasal splint no longer in place, no deformity noted, swelling resolved, no bruising noted, unable to assess nare patency Extremities: extremities normal, atraumatic, no cyanosis or edema   Lab Results:  CBC Latest Ref Rng & Units 07/14/2020 07/13/2020 07/12/2020  WBC 4.0 - 10.5 K/uL 12.1(H) 13.0(H) 12.8(H)  Hemoglobin 12.0 - 15.0 g/dL 10.2(L) 10.3(L) 10.8(L)  Hematocrit 36 - 46 % 31.7(L) 32.0(L) 33.7(L)  Platelets 150 - 400 K/uL 281 492(H) 384    BMET Recent Labs    07/13/20 0254 07/14/20 0045  NA 139 139  K 3.9 3.9  CL 107 107  CO2 24 24  GLUCOSE 140* 142*  BUN 20 22*  CREATININE 0.54 0.54  CALCIUM 8.8* 8.8*   PT/INR No results for input(s): LABPROT, INR in the last 72 hours. ABG No results for input(s): PHART, HCO3 in the last 72 hours.  Invalid input(s): PCO2, PO2  Studies/Results: No results found.  Anti-infectives: Anti-infectives (From admission, onward)   Start     Dose/Rate Route Frequency Ordered Stop   07/07/20 1400  ceFAZolin (ANCEF) IVPB 2g/100 mL premix        2  g 200 mL/hr over 30 Minutes Intravenous Every 8 hours 07/07/20 1238 07/11/20 2208   07/05/20 1200  ceFEPIme (MAXIPIME) 2 g in sodium chloride 0.9 % 100 mL IVPB  Status:  Discontinued        2 g 200 mL/hr over 30 Minutes Intravenous Every 8 hours 07/05/20 1058 07/07/20 1238   06/25/20 0215  Ampicillin-Sulbactam (UNASYN) 3 g in sodium chloride 0.9 % 100 mL IVPB        3 g 200 mL/hr over 30 Minutes Intravenous  Once 06/25/20 0148 06/25/20 0329      Assessment/Plan: s/p Procedure(s): CLOSED REDUCTION NASAL FRACTURE OPEN REDUCTION INTERNAL FIXATION (ORIF) MANDIBULAR FRACTURE  No change in plan Nasal splint no longer in place     LOS: 19 days    Leslee Home, PA-C 07/14/2020

## 2020-07-15 LAB — BASIC METABOLIC PANEL
Anion gap: 10 (ref 5–15)
BUN: 21 mg/dL — ABNORMAL HIGH (ref 6–20)
CO2: 24 mmol/L (ref 22–32)
Calcium: 8.9 mg/dL (ref 8.9–10.3)
Chloride: 106 mmol/L (ref 98–111)
Creatinine, Ser: 0.53 mg/dL (ref 0.44–1.00)
GFR, Estimated: >60 mL/min (ref >60)
Glucose, Bld: 121 mg/dL — ABNORMAL HIGH (ref 70–99)
Potassium: 4.4 mmol/L (ref 3.5–5.1)
Sodium: 140 mmol/L (ref 135–145)

## 2020-07-15 LAB — CBC
HCT: 33.9 % — ABNORMAL LOW (ref 36.0–46.0)
Hemoglobin: 10.9 g/dL — ABNORMAL LOW (ref 12.0–15.0)
MCH: 32.2 pg (ref 26.0–34.0)
MCHC: 32.2 g/dL (ref 30.0–36.0)
MCV: 100.3 fL — ABNORMAL HIGH (ref 80.0–100.0)
Platelets: 513 10*3/uL — ABNORMAL HIGH (ref 150–400)
RBC: 3.38 MIL/uL — ABNORMAL LOW (ref 3.87–5.11)
RDW: 13.5 % (ref 11.5–15.5)
WBC: 10.7 10*3/uL — ABNORMAL HIGH (ref 4.0–10.5)
nRBC: 0 % (ref 0.0–0.2)

## 2020-07-15 LAB — GLUCOSE, CAPILLARY
Glucose-Capillary: 110 mg/dL — ABNORMAL HIGH (ref 70–99)
Glucose-Capillary: 115 mg/dL — ABNORMAL HIGH (ref 70–99)
Glucose-Capillary: 123 mg/dL — ABNORMAL HIGH (ref 70–99)
Glucose-Capillary: 126 mg/dL — ABNORMAL HIGH (ref 70–99)
Glucose-Capillary: 128 mg/dL — ABNORMAL HIGH (ref 70–99)
Glucose-Capillary: 133 mg/dL — ABNORMAL HIGH (ref 70–99)

## 2020-07-15 LAB — PHOSPHORUS: Phosphorus: 4.2 mg/dL (ref 2.5–4.6)

## 2020-07-15 LAB — MAGNESIUM: Magnesium: 2 mg/dL (ref 1.7–2.4)
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
## 2020-07-15 NOTE — Progress Notes (Signed)
Patient ID: Tami Brown, female   DOB: February 27, 1996, 24 y.o.   MRN: [id number] BP 127/75   Pulse 99   Temp 99 F (37.2 C) (Axillary)   Resp (!) 23   Ht 5\' 9"  (1.753 m)   Wt 56.4 kg   SpO2 99%   BMI 18.36 kg/m  Alert, followed some simple commands Moving right side Wounds are clean, dry and without signs of infection

## 2020-07-16 LAB — GLUCOSE, CAPILLARY
Glucose-Capillary: 104 mg/dL — ABNORMAL HIGH (ref 70–99)
Glucose-Capillary: 109 mg/dL — ABNORMAL HIGH (ref 70–99)
Glucose-Capillary: 116 mg/dL — ABNORMAL HIGH (ref 70–99)
Glucose-Capillary: 117 mg/dL — ABNORMAL HIGH (ref 70–99)
Glucose-Capillary: 119 mg/dL — ABNORMAL HIGH (ref 70–99)
Glucose-Capillary: 124 mg/dL — ABNORMAL HIGH (ref 70–99)

## 2020-07-16 LAB — BASIC METABOLIC PANEL
Anion gap: 10 (ref 5–15)
BUN: 20 mg/dL (ref 6–20)
CO2: 24 mmol/L (ref 22–32)
Calcium: 9.3 mg/dL (ref 8.9–10.3)
Chloride: 106 mmol/L (ref 98–111)
Creatinine, Ser: 0.53 mg/dL (ref 0.44–1.00)
GFR, Estimated: >60 mL/min (ref >60)
Glucose, Bld: 119 mg/dL — ABNORMAL HIGH (ref 70–99)
Potassium: 4.1 mmol/L (ref 3.5–5.1)
Sodium: 140 mmol/L (ref 135–145)

## 2020-07-16 NOTE — Progress Notes (Signed)
Patient ID: Tami Brown, female   DOB: 06-18-1996, 24 y.o.   MRN: [id number] BP 127/76   Pulse 96   Temp 99.2 F (37.3 C) (Axillary)   Resp (!) 23   Ht 5\' 9"  (1.753 m)   Wt 56.4 kg   SpO2 99%   BMI 18.36 kg/m  Alert, will follow a few commands Movements are more purposeful on right side Wounds are clean, and dry Making slow improvements
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
## 2020-07-17 LAB — GLUCOSE, CAPILLARY
Glucose-Capillary: 121 mg/dL — ABNORMAL HIGH (ref 70–99)
Glucose-Capillary: 114 mg/dL — ABNORMAL HIGH (ref 70–99)
Glucose-Capillary: 124 mg/dL — ABNORMAL HIGH (ref 70–99)
Glucose-Capillary: 101 mg/dL — ABNORMAL HIGH (ref 70–99)
Glucose-Capillary: 106 mg/dL — ABNORMAL HIGH (ref 70–99)
Glucose-Capillary: 115 mg/dL — ABNORMAL HIGH (ref 70–99)

## 2020-07-17 NOTE — Progress Notes (Signed)
Patient ID: Tami Brown, female   DOB: 02/04/1996, 24 y.o.   MRN: [id number] BP 117/82   Pulse 92   Temp 98.6 F (37 C) (Axillary)   Resp (!) 25   Ht 5\' 9"  (1.753 m)   Wt 56.4 kg   SpO2 98%   BMI 18.36 kg/m  Alert, following commands Moving right side well Left side plegic due to hematoma in internal capsule Improving , has come quite far this past week

## 2020-07-17 NOTE — Progress Notes (Signed)
RT NOTES: Pt respiratory rate now in 30s. Placed patient back on vent to rest for the night. Will continue to monitor.
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
## 2020-07-18 LAB — GLUCOSE, CAPILLARY
Glucose-Capillary: 103 mg/dL — ABNORMAL HIGH (ref 70–99)
Glucose-Capillary: 104 mg/dL — ABNORMAL HIGH (ref 70–99)
Glucose-Capillary: 107 mg/dL — ABNORMAL HIGH (ref 70–99)
Glucose-Capillary: 110 mg/dL — ABNORMAL HIGH (ref 70–99)
Glucose-Capillary: 112 mg/dL — ABNORMAL HIGH (ref 70–99)
Glucose-Capillary: 119 mg/dL — ABNORMAL HIGH (ref 70–99)
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
## 2020-07-18 NOTE — Progress Notes (Signed)
Patient ID: Tami Brown, female   DOB: [birth year], 24 y.o.   MRN: [id number] BP 118/72 (BP Location: Right Arm)   Pulse 99   Temp 98.8 F (37.1 C) (Axillary)   Resp (!) 29   Ht 5\' 9"  (1.753 m)   Wt 56.4 kg   SpO2 100%   BMI 18.36 kg/m  Opens eyes to voice Purposeful movement on right side Wounds are clean and dry Improving

## 2020-07-19 LAB — COMPREHENSIVE METABOLIC PANEL
ALT: 48 U/L — ABNORMAL HIGH (ref 0–44)
AST: 56 U/L — ABNORMAL HIGH (ref 15–41)
Albumin: 2.8 g/dL — ABNORMAL LOW (ref 3.5–5.0)
Alkaline Phosphatase: 103 U/L (ref 38–126)
Anion gap: 11 (ref 5–15)
BUN: 21 mg/dL — ABNORMAL HIGH (ref 6–20)
CO2: 20 mmol/L — ABNORMAL LOW (ref 22–32)
Calcium: 9.1 mg/dL (ref 8.9–10.3)
Chloride: 104 mmol/L (ref 98–111)
Creatinine, Ser: 0.47 mg/dL (ref 0.44–1.00)
GFR, Estimated: >60 mL/min (ref >60)
Glucose, Bld: 114 mg/dL — ABNORMAL HIGH (ref 70–99)
Potassium: 5.7 mmol/L — ABNORMAL HIGH (ref 3.5–5.1)
Sodium: 135 mmol/L (ref 135–145)
Total Bilirubin: 1.3 mg/dL — ABNORMAL HIGH (ref 0.3–1.2)
Total Protein: 8.1 g/dL (ref 6.5–8.1)

## 2020-07-19 LAB — GLUCOSE, CAPILLARY
Glucose-Capillary: 113 mg/dL — ABNORMAL HIGH (ref 70–99)
Glucose-Capillary: 105 mg/dL — ABNORMAL HIGH (ref 70–99)
Glucose-Capillary: 121 mg/dL — ABNORMAL HIGH (ref 70–99)
Glucose-Capillary: 109 mg/dL — ABNORMAL HIGH (ref 70–99)
Glucose-Capillary: 84 mg/dL (ref 70–99)

## 2020-07-19 MED ORDER — GUAIFENESIN 100 MG/5ML PO SOLN
10.0000 mL | ORAL | Status: DC
  Filled 2020-07-19: qty 30

## 2020-07-19 MED ORDER — GUAIFENESIN 100 MG/5ML PO SOLN
10.0000 mL | ORAL | Status: DC
  Administered 2020-07-19 – 2020-09-01 (×250): 200 mg
  Filled 2020-07-19: qty 20
  Filled 2020-07-19 (×15): qty 10
  Filled 2020-07-19: qty 20
  Filled 2020-07-19 (×13): qty 10
  Filled 2020-07-19: qty 20
  Filled 2020-07-19 (×26): qty 10
  Filled 2020-07-19 (×2): qty 20
  Filled 2020-07-19 (×46): qty 10
  Filled 2020-07-19: qty 20
  Filled 2020-07-19 (×61): qty 10
  Filled 2020-07-19: qty 20
  Filled 2020-07-19 (×22): qty 10
  Filled 2020-07-19: qty 20
  Filled 2020-07-19 (×4): qty 10
  Filled 2020-07-19: qty 20
  Filled 2020-07-19 (×9): qty 10
  Filled 2020-07-19: qty 20
  Filled 2020-07-19 (×29): qty 10
  Filled 2020-07-19: qty 20
  Filled 2020-07-19 (×14): qty 10
  Filled 2020-07-19: qty 20
  Filled 2020-07-19 (×12): qty 10

## 2020-07-19 MED ORDER — ONDANSETRON HCL 4 MG/2ML IJ SOLN
4.0000 mg | Freq: Four times a day (QID) | INTRAMUSCULAR | Status: DC
  Administered 2020-07-20 – 2020-07-21 (×7): 4 mg via INTRAVENOUS
  Filled 2020-07-19 (×7): qty 2

## 2020-07-19 MED ORDER — AMANTADINE HCL 50 MG/5ML PO SOLN
100.0000 mg | Freq: Two times a day (BID) | ORAL | Status: DC
  Administered 2020-07-19 – 2020-07-20 (×4): 100 mg
  Filled 2020-07-19 (×5): qty 10

## 2020-07-19 MED ORDER — ONDANSETRON HCL 4 MG/2ML IJ SOLN
INTRAMUSCULAR | Status: AC
  Administered 2020-07-19: 4 mg
  Filled 2020-07-19: qty 2
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
## 2020-07-19 NOTE — Consult Note (Signed)
WOC Nurse wound follow up Wound type:MDRPI; left neck from C collar Measurement: 0.4cm x 1.0cm x 0.2cm  Wound bed:red, moist  Drainage (amount, consistency, odor) minimal, serosangionus dressing  Periwound:frequently moist. Trach in place.  Dressing procedure/placement/frequency:Continue Xeroform to wound bed. Cover with foam dressing. Change Mon/Wed/Fri.   WOC Nurse team will follow with you and see patient within 10 days for wound assessments.  Please notify WOC nurses of any acute changes in the wounds or any new areas of concern [facility] MSN, RN,CWOCN, CNS, CWON-AP [phone]
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
## 2020-07-19 NOTE — Progress Notes (Signed)
Called pts mother to update on pt moving. Answered any questions she had. Vital signs stable prior to transport to 4NP05. Met RN in room.

## 2020-07-20 DIAGNOSIS — S069X4D Unspecified intracranial injury with loss of consciousness of 6 hours to 24 hours, subsequent encounter: Secondary | ICD-10-CM

## 2020-07-20 LAB — GLUCOSE, CAPILLARY
Glucose-Capillary: 103 mg/dL — ABNORMAL HIGH (ref 70–99)
Glucose-Capillary: 107 mg/dL — ABNORMAL HIGH (ref 70–99)
Glucose-Capillary: 109 mg/dL — ABNORMAL HIGH (ref 70–99)
Glucose-Capillary: 117 mg/dL — ABNORMAL HIGH (ref 70–99)
Glucose-Capillary: 117 mg/dL — ABNORMAL HIGH (ref 70–99)
Glucose-Capillary: 97 mg/dL (ref 70–99)
Glucose-Capillary: 99 mg/dL (ref 70–99)

## 2020-07-20 LAB — BASIC METABOLIC PANEL
Anion gap: 10 (ref 5–15)
BUN: 22 mg/dL — ABNORMAL HIGH (ref 6–20)
CO2: 25 mmol/L (ref 22–32)
Calcium: 9.5 mg/dL (ref 8.9–10.3)
Chloride: 102 mmol/L (ref 98–111)
Creatinine, Ser: 0.57 mg/dL (ref 0.44–1.00)
GFR, Estimated: >60 mL/min (ref >60)
Glucose, Bld: 108 mg/dL — ABNORMAL HIGH (ref 70–99)
Potassium: 4.6 mmol/L (ref 3.5–5.1)
Sodium: 137 mmol/L (ref 135–145)

## 2020-07-20 LAB — CBC
HCT: 39.9 % (ref 36.0–46.0)
Hemoglobin: 13 g/dL (ref 12.0–15.0)
MCH: 32 pg (ref 26.0–34.0)
MCHC: 32.6 g/dL (ref 30.0–36.0)
MCV: 98.3 fL (ref 80.0–100.0)
Platelets: 394 10*3/uL (ref 150–400)
RBC: 4.06 MIL/uL (ref 3.87–5.11)
RDW: 13.2 % (ref 11.5–15.5)
WBC: 10.8 10*3/uL — ABNORMAL HIGH (ref 4.0–10.5)
nRBC: 0 % (ref 0.0–0.2)

## 2020-07-20 MED ORDER — METOPROLOL TARTRATE 25 MG/10 ML ORAL SUSPENSION
75.0000 mg | Freq: Two times a day (BID) | ORAL | Status: DC
  Administered 2020-07-20 – 2020-09-03 (×87): 75 mg
  Filled 2020-07-20 (×92): qty 30

## 2020-07-20 NOTE — Progress Notes (Signed)
Patient ID: Tami Brown, female   DOB: 1996-04-05, 24 y.o.   MRN: [id number] BP 109/62 (BP Location: Right Arm)   Pulse (!) 106   Temp 98.9 F (37.2 C) (Axillary)   Resp 20   Ht 5\' 9"  (1.753 m)   Wt 56.4 kg   SpO2 92%   BMI 18.36 kg/m  Following some commands Moving right side Wound is clean, and dry, no signs of infection
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
## 2020-07-21 ENCOUNTER — Inpatient Hospital Stay (HOSPITAL_COMMUNITY): Payer: Medicaid - Out of State

## 2020-07-21 LAB — GLUCOSE, CAPILLARY
Glucose-Capillary: 109 mg/dL — ABNORMAL HIGH (ref 70–99)
Glucose-Capillary: 97 mg/dL (ref 70–99)
Glucose-Capillary: 97 mg/dL (ref 70–99)
Glucose-Capillary: 100 mg/dL — ABNORMAL HIGH (ref 70–99)
Glucose-Capillary: 120 mg/dL — ABNORMAL HIGH (ref 70–99)

## 2020-07-21 MED ORDER — AMANTADINE HCL 50 MG/5ML PO SOLN
100.0000 mg | Freq: Two times a day (BID) | ORAL | Status: DC
  Administered 2020-07-21 – 2020-09-03 (×86): 100 mg
  Filled 2020-07-21 (×90): qty 10

## 2020-07-21 MED ORDER — ONDANSETRON HCL 4 MG/2ML IJ SOLN
4.0000 mg | Freq: Four times a day (QID) | INTRAMUSCULAR | Status: DC | PRN
  Administered 2020-07-21 – 2020-08-26 (×9): 4 mg via INTRAVENOUS
  Filled 2020-07-21 (×9): qty 2

## 2020-07-21 MED ORDER — AMANTADINE HCL 50 MG/5ML PO SOLN
100.0000 mg | Freq: Once | ORAL | Status: AC
  Administered 2020-07-21: 100 mg
  Filled 2020-07-21: qty 10
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
## 2020-07-21 NOTE — Progress Notes (Signed)
I attempted to call patients father to give update without answer.
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
## 2020-07-21 NOTE — Progress Notes (Signed)
Verbal order received from Casimiro Needle, PA-C to restart tube feeding starting at 15 ml/hr and titrate up to goal of 65 ml/hr. Tube feedings restarted.

## 2020-07-21 NOTE — Progress Notes (Signed)
Patient ID: Tami Brown, female   DOB: [birth year], 24 y.o.   MRN: [id number] BP 116/63 (BP Location: Right Wrist)   Pulse 95   Temp 98 F (36.7 C) (Axillary)   Resp (!) 22   Ht 5\' 9"  (1.753 m)   Wt 56.4 kg   SpO2 100%   BMI 18.36 kg/m  Moving right side well Eyes open, perrl  Tracks Exam unchanged

## 2020-07-21 NOTE — Progress Notes (Signed)
Inpatient Rehabilitation-Admissions Coordinator   Met with pt's family to discuss CIR recommendation and discuss ELOS and expected functional outcomes. The patients family would like to pursue this rehab and understand we will follow for consistent participation and progression with therapies before admission to CIR. The patient has insurance coverage and notified admissions of her status.  Will follow and hopefully admit once she is able to tolerate and participate in CIR program.   Raechel Ache, OTR/L  Rehab Admissions Coordinator  [phone] 07/21/2020 3:08 PM

## 2020-07-21 NOTE — Progress Notes (Signed)
Called by RT/RN. Patient had episode of emesis they stated looked like TF's. Now on 98% TC. TF's held already. CXR ordered. Currently appears in NAD. Lungs with coarse expiratory upper lung sounds and clear at the bases. Abd is soft without rigidity or guarding. PEG tube in place. +BS. Will follow up on CXR.   Leary Roca 9:18 AM 07/21/2020

## 2020-07-21 NOTE — Progress Notes (Signed)
20 Days Post-Op  Subjective: Post op from closed reduction of nasal fx and ORIF of mandible fx with Dr. Ulice Bold on 07/01/20.    Objective: Vital signs in last 24 hours: Temp:  [98 F (36.7 C)-98.9 F (37.2 C)] 98 F (36.7 C) (11/17 1106) Pulse Rate:  [89-139] 95 (11/17 1115) Resp:  [19-26] 22 (11/17 1115) BP: (100-127)/(59-82) 116/63 (11/17 1106) SpO2:  [87 %-100 %] 100 % (11/17 1115) FiO2 (%):  [21 %-60 %] 40 % (11/17 1115) Weight:  [56.4 kg] 56.4 kg (11/17 0500) Last BM Date: 07/17/20  Intake/Output from previous day: 11/16 0701 - 11/17 0700 In: 1380 [NG/GT:1380] Out: 1550 [Urine:1550] Intake/Output this shift: Total I/O In: -  Out: 1100 [Urine:1100]  General appearance: lying in bed, NAD Eyes: opens eyes to verbal stimuli Nose: Appears stable, unable to evaluate patency Neck: trach in place Cardio: tachycardic Extremities: No LE edema noted Incision/Wound: lip mucosal incision in-tact, vicryl sutures noted  Lab Results:  CBC Latest Ref Rng & Units 07/20/2020 07/15/2020 07/14/2020  WBC 4.0 - 10.5 K/uL 10.8(H) 10.7(H) 12.1(H)  Hemoglobin 12.0 - 15.0 g/dL 79.0 10.9(L) 10.2(L)  Hematocrit 36 - 46 % 39.9 33.9(L) 31.7(L)  Platelets 150 - 400 K/uL 394 513(H) 281    BMET Recent Labs    07/19/20 0711 07/20/20 0914  NA 135 137  K 5.7* 4.6  CL 104 102  CO2 20* 25  GLUCOSE 114* 108*  BUN 21* 22*  CREATININE 0.47 0.57  CALCIUM 9.1 9.5   PT/INR No results for input(s): LABPROT, INR in the last 72 hours. ABG No results for input(s): PHART, HCO3 in the last 72 hours.  Invalid input(s): PCO2, PO2  Studies/Results: DG CHEST PORT 1 VIEW  Result Date: 07/21/2020 CLINICAL DATA:  Emesis EXAM: PORTABLE CHEST 1 VIEW COMPARISON:  July 05, 2020 FINDINGS: Suspect IMPRESSION: Endotracheal tube as described without pneumothorax. Persistent airspace opacity consistent with pneumonia left base with associated atelectasis. No new opacity evident. Stable cardiac  silhouette. Electronically Signed   By: Bretta Bang III M.D.   On: 07/21/2020 09:38    Anti-infectives: Anti-infectives (From admission, onward)   Start     Dose/Rate Route Frequency Ordered Stop   07/07/20 1400  ceFAZolin (ANCEF) IVPB 2g/100 mL premix        2 g 200 mL/hr over 30 Minutes Intravenous Every 8 hours 07/07/20 1238 07/11/20 2208   07/05/20 1200  ceFEPIme (MAXIPIME) 2 g in sodium chloride 0.9 % 100 mL IVPB  Status:  Discontinued        2 g 200 mL/hr over 30 Minutes Intravenous Every 8 hours 07/05/20 1058 07/07/20 1238   06/25/20 0215  Ampicillin-Sulbactam (UNASYN) 3 g in sodium chloride 0.9 % 100 mL IVPB        3 g 200 mL/hr over 30 Minutes Intravenous  Once 06/25/20 0148 06/25/20 0329      Assessment/Plan: s/p Procedure(s): CLOSED REDUCTION NASAL FRACTURE OPEN REDUCTION INTERNAL FIXATION (ORIF) MANDIBULAR FRACTURE TRACHEOSTOMY  No changes    LOS: 26 days    Leslee Home, PA-C 07/21/2020
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
## 2020-07-22 DIAGNOSIS — L8990 Pressure ulcer of unspecified site, unspecified stage: Secondary | ICD-10-CM | POA: Insufficient documentation

## 2020-07-22 LAB — GLUCOSE, CAPILLARY
Glucose-Capillary: 100 mg/dL — ABNORMAL HIGH (ref 70–99)
Glucose-Capillary: 105 mg/dL — ABNORMAL HIGH (ref 70–99)
Glucose-Capillary: 107 mg/dL — ABNORMAL HIGH (ref 70–99)
Glucose-Capillary: 110 mg/dL — ABNORMAL HIGH (ref 70–99)
Glucose-Capillary: 119 mg/dL — ABNORMAL HIGH (ref 70–99)
Glucose-Capillary: 125 mg/dL — ABNORMAL HIGH (ref 70–99)
Glucose-Capillary: 91 mg/dL (ref 70–99)

## 2020-07-22 LAB — BASIC METABOLIC PANEL
Anion gap: 10 (ref 5–15)
BUN: 17 mg/dL (ref 6–20)
CO2: 25 mmol/L (ref 22–32)
Calcium: 9 mg/dL (ref 8.9–10.3)
Chloride: 103 mmol/L (ref 98–111)
Creatinine, Ser: 0.49 mg/dL (ref 0.44–1.00)
GFR, Estimated: >60 mL/min (ref >60)
Glucose, Bld: 103 mg/dL — ABNORMAL HIGH (ref 70–99)
Potassium: 4.3 mmol/L (ref 3.5–5.1)
Sodium: 138 mmol/L (ref 135–145)

## 2020-07-22 LAB — CBC
HCT: 34.5 % — ABNORMAL LOW (ref 36.0–46.0)
Hemoglobin: 11.1 g/dL — ABNORMAL LOW (ref 12.0–15.0)
MCH: 31.5 pg (ref 26.0–34.0)
MCHC: 32.2 g/dL (ref 30.0–36.0)
MCV: 98 fL (ref 80.0–100.0)
Platelets: 357 10*3/uL (ref 150–400)
RBC: 3.52 MIL/uL — ABNORMAL LOW (ref 3.87–5.11)
RDW: 13 % (ref 11.5–15.5)
WBC: 9.7 10*3/uL (ref 4.0–10.5)
nRBC: 0 % (ref 0.0–0.2)

## 2020-07-22 MED ORDER — PROSOURCE TF PO LIQD
90.0000 mL | Freq: Two times a day (BID) | ORAL | Status: DC
  Administered 2020-07-22 – 2020-09-03 (×87): 90 mL
  Filled 2020-07-22 (×88): qty 90

## 2020-07-22 MED ORDER — FREE WATER
200.0000 mL | Freq: Four times a day (QID) | Status: DC
  Administered 2020-07-22 – 2020-08-18 (×104): 200 mL

## 2020-07-22 MED ORDER — OSMOLITE 1.5 CAL PO LIQD
1000.0000 mL | ORAL | Status: DC
  Administered 2020-07-22 – 2020-08-12 (×11): 1000 mL
  Filled 2020-07-22 (×24): qty 1000

## 2020-07-22 NOTE — TOC Progression Note (Signed)
Transition of Care [facility]) - Progression Note    Patient Details  Name: Tami Brown MRN: [id number] Date of Birth: Mar 27, 1996  [facility] CM/SW Contact  Glennon Mac, RN Phone Number: 07/22/2020, 4:42 PM  Clinical Narrative:   Patient's mother's FMLA paperwork completed and faxed to her employer.      Expected Discharge Plan: IP Rehab Facility Barriers to Discharge: Continued Medical Work up  Expected Discharge Plan and Services Expected Discharge Plan: IP Rehab Facility   Discharge Planning Services: CM Consult   Living arrangements for the past 2 months: Single Family Home                                       Social Determinants of Health (SDOH) Interventions    Readmission Risk Interventions No flowsheet data found.  Quintella Baton, RN, BSN  Trauma/Neuro ICU Case Manager [phone]
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
## 2020-07-22 NOTE — Progress Notes (Signed)
Patient ID: Tami Brown, female   DOB: 1995/09/20, 24 y.o.   MRN: [id number] BP 112/69 (BP Location: Right Arm)   Pulse 97   Temp 97.8 F (36.6 C) (Oral)   Resp 19   Ht 5\' 9"  (1.753 m)   Wt 66 kg   SpO2 97%   BMI 21.49 kg/m  Opens eyes, moving right side well Wounds are clean and dry Slow improvement, tho improvement is quite real
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
## 2020-07-23 LAB — GLUCOSE, CAPILLARY
Glucose-Capillary: 100 mg/dL — ABNORMAL HIGH (ref 70–99)
Glucose-Capillary: 110 mg/dL — ABNORMAL HIGH (ref 70–99)
Glucose-Capillary: 123 mg/dL — ABNORMAL HIGH (ref 70–99)
Glucose-Capillary: 124 mg/dL — ABNORMAL HIGH (ref 70–99)
Glucose-Capillary: 124 mg/dL — ABNORMAL HIGH (ref 70–99)
Glucose-Capillary: 125 mg/dL — ABNORMAL HIGH (ref 70–99)

## 2020-07-23 NOTE — Progress Notes (Signed)
Inpatient Rehabilitation-Admissions Coordinator   Per therapy, pt is progressing well and is demonstrating behaviors consistent with a RLAS IV. AC will begin insurance authorization process for possible admit. Will need updated PT note prior to opening case.   Cheri Rous, OTR/L  Rehab Admissions Coordinator  [phone] 07/23/2020 4:19 PM
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
## 2020-07-23 NOTE — Procedures (Signed)
Tracheostomy Change Note  Patient Details:   Name: Tami Brown DOB: [birth year] MRN: [id number]    Airway Documentation:     Evaluation  O2 sats: stable throughout Complications: No apparent complications Patient did tolerate procedure well. Bilateral Breath Sounds: Clear, Diminished     RT x2 changed patients trach from #6 shiley cuffed to #4 shiley cuffless per MD order. Patient tolerated well sats 100%. Positive color change noted by RT x2. Small amount of pink tinged secretions after trach change, RN aware. RT will continue to monitor.  Jaquelyn Bitter 07/23/2020, 4:29 PM
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
## 2020-07-24 LAB — GLUCOSE, CAPILLARY
Glucose-Capillary: 116 mg/dL — ABNORMAL HIGH (ref 70–99)
Glucose-Capillary: 131 mg/dL — ABNORMAL HIGH (ref 70–99)
Glucose-Capillary: 117 mg/dL — ABNORMAL HIGH (ref 70–99)
Glucose-Capillary: 123 mg/dL — ABNORMAL HIGH (ref 70–99)
Glucose-Capillary: 94 mg/dL (ref 70–99)
Glucose-Capillary: 129 mg/dL — ABNORMAL HIGH (ref 70–99)

## 2020-07-24 LAB — BASIC METABOLIC PANEL
Anion gap: 12 (ref 5–15)
BUN: 16 mg/dL (ref 6–20)
CO2: 21 mmol/L — ABNORMAL LOW (ref 22–32)
Calcium: 9.3 mg/dL (ref 8.9–10.3)
Chloride: 105 mmol/L (ref 98–111)
Creatinine, Ser: 0.44 mg/dL (ref 0.44–1.00)
GFR, Estimated: >60 mL/min (ref >60)
Glucose, Bld: 101 mg/dL — ABNORMAL HIGH (ref 70–99)
Potassium: 5.8 mmol/L — ABNORMAL HIGH (ref 3.5–5.1)
Sodium: 138 mmol/L (ref 135–145)

## 2020-07-24 MED ORDER — SODIUM ZIRCONIUM CYCLOSILICATE 10 G PO PACK
10.0000 g | PACK | Freq: Once | ORAL | Status: DC
  Filled 2020-07-24 (×2): qty 1
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
## 2020-07-24 NOTE — Plan of Care (Signed)
  Problem: Respiratory: Goal: Ability to maintain a clear airway and adequate ventilation will improve Outcome: Progressing   Problem: Nutritional: Goal: Risk of aspiration will decrease Outcome: Progressing   Problem: Respiratory: Goal: Will regain and/or maintain adequate ventilation Outcome: Progressing   Problem: Skin Integrity: Goal: Risk for impaired skin integrity will decrease Outcome: Progressing   Problem: Pain Managment: Goal: General experience of comfort will improve Outcome: Progressing   Problem: Safety: Goal: Ability to remain free from injury will improve Outcome: Progressing

## 2020-07-24 NOTE — Progress Notes (Signed)
Incontinent care provided. Complete bed linen change including new hospital gown

## 2020-07-24 NOTE — Progress Notes (Signed)
Complete bed change done. Incontinent of stool. Patient bathed from head to toe with all new linen provided.

## 2020-07-25 LAB — BASIC METABOLIC PANEL
Anion gap: 9 (ref 5–15)
BUN: 13 mg/dL (ref 6–20)
CO2: 24 mmol/L (ref 22–32)
Calcium: 9.2 mg/dL (ref 8.9–10.3)
Chloride: 105 mmol/L (ref 98–111)
Creatinine, Ser: 0.47 mg/dL (ref 0.44–1.00)
GFR, Estimated: >60 mL/min (ref >60)
Glucose, Bld: 101 mg/dL — ABNORMAL HIGH (ref 70–99)
Potassium: 4 mmol/L (ref 3.5–5.1)
Sodium: 138 mmol/L (ref 135–145)

## 2020-07-25 LAB — GLUCOSE, CAPILLARY
Glucose-Capillary: 107 mg/dL — ABNORMAL HIGH (ref 70–99)
Glucose-Capillary: 127 mg/dL — ABNORMAL HIGH (ref 70–99)
Glucose-Capillary: 116 mg/dL — ABNORMAL HIGH (ref 70–99)
Glucose-Capillary: 110 mg/dL — ABNORMAL HIGH (ref 70–99)
Glucose-Capillary: 120 mg/dL — ABNORMAL HIGH (ref 70–99)
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
## 2020-07-26 LAB — BASIC METABOLIC PANEL
Anion gap: 8 (ref 5–15)
BUN: 14 mg/dL (ref 6–20)
CO2: 25 mmol/L (ref 22–32)
Calcium: 9.3 mg/dL (ref 8.9–10.3)
Chloride: 107 mmol/L (ref 98–111)
Creatinine, Ser: 0.54 mg/dL (ref 0.44–1.00)
GFR, Estimated: >60 mL/min (ref >60)
Glucose, Bld: 125 mg/dL — ABNORMAL HIGH (ref 70–99)
Potassium: 4.1 mmol/L (ref 3.5–5.1)
Sodium: 140 mmol/L (ref 135–145)

## 2020-07-26 LAB — GLUCOSE, CAPILLARY
Glucose-Capillary: 102 mg/dL — ABNORMAL HIGH (ref 70–99)
Glucose-Capillary: 111 mg/dL — ABNORMAL HIGH (ref 70–99)
Glucose-Capillary: 117 mg/dL — ABNORMAL HIGH (ref 70–99)
Glucose-Capillary: 125 mg/dL — ABNORMAL HIGH (ref 70–99)
Glucose-Capillary: 145 mg/dL — ABNORMAL HIGH (ref 70–99)
Glucose-Capillary: 95 mg/dL (ref 70–99)
Glucose-Capillary: 96 mg/dL (ref 70–99)
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
## 2020-07-26 NOTE — Consult Note (Signed)
WOC Nurse wound follow up Patient receiving care in [facility] 4NP05 Wound type:MDRPI; left neck from C collar Wound bed:red, moist  Drainage (amount, consistency, odor)minimal, serosangionus dressing  Periwound:frequently moist. Trach in place. Dressing procedure/placement/frequency:Continue Xeroform to wound bed. Cover with foam dressing. Change Mon/Wed/Fri.  Renaldo Reel Katrinka Blazing, MSN, RN, CMSRN, Angus Seller, [facility] Wound Treatment Associate Pager [phone]
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
## 2020-07-27 LAB — GLUCOSE, CAPILLARY
Glucose-Capillary: 101 mg/dL — ABNORMAL HIGH (ref 70–99)
Glucose-Capillary: 110 mg/dL — ABNORMAL HIGH (ref 70–99)
Glucose-Capillary: 114 mg/dL — ABNORMAL HIGH (ref 70–99)
Glucose-Capillary: 114 mg/dL — ABNORMAL HIGH (ref 70–99)
Glucose-Capillary: 120 mg/dL — ABNORMAL HIGH (ref 70–99)
Glucose-Capillary: 125 mg/dL — ABNORMAL HIGH (ref 70–99)

## 2020-07-27 LAB — BASIC METABOLIC PANEL
Anion gap: 12 (ref 5–15)
BUN: 16 mg/dL (ref 6–20)
CO2: 22 mmol/L (ref 22–32)
Calcium: 9 mg/dL (ref 8.9–10.3)
Chloride: 102 mmol/L (ref 98–111)
Creatinine, Ser: 0.48 mg/dL (ref 0.44–1.00)
GFR, Estimated: >60 mL/min (ref >60)
Glucose, Bld: 103 mg/dL — ABNORMAL HIGH (ref 70–99)
Potassium: 4.1 mmol/L (ref 3.5–5.1)
Sodium: 136 mmol/L (ref 135–145)
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
## 2020-07-27 NOTE — Progress Notes (Signed)
Orthopedic Tech Progress Note Patient Details:  Tami Brown 1996/05/17 [id number] THERAPY called requesting a SOFT COLLAR, once I got up there she realize patient has a TRACH so I went and got PHILLY COLLAR for patient. THERAPY applied. Ortho Devices Type of Ortho Device: [place] cervical collar Ortho Device/Splint Location: NECK Ortho Device/Splint Interventions: Other (comment)   Post Interventions Patient Tolerated: Well Instructions Provided: Care of device   [name] 07/27/2020, 8:56 AM
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
## 2020-07-28 LAB — GLUCOSE, CAPILLARY
Glucose-Capillary: 121 mg/dL — ABNORMAL HIGH (ref 70–99)
Glucose-Capillary: 106 mg/dL — ABNORMAL HIGH (ref 70–99)
Glucose-Capillary: 108 mg/dL — ABNORMAL HIGH (ref 70–99)
Glucose-Capillary: 94 mg/dL (ref 70–99)
Glucose-Capillary: 124 mg/dL — ABNORMAL HIGH (ref 70–99)
Glucose-Capillary: 99 mg/dL (ref 70–99)
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
## 2020-07-28 NOTE — Progress Notes (Signed)
Patient resting in bed near end of shift. Patient non-verbal continues with O2 at 5L via trach collar. Pain medication recently given to patient. Osmolite infusing at 60 ml/hr. Patient had 1 unmeasured urine during shift. Patient able to follow some commands but not all. Will Actor at times. Dressings changed during shift. Safety measures remain in place. Will continue to monitor and SBARR to day shift nurse. Plan is for Inpatient rehab.

## 2020-07-29 LAB — BASIC METABOLIC PANEL
Anion gap: 13 (ref 5–15)
BUN: 14 mg/dL (ref 6–20)
CO2: 22 mmol/L (ref 22–32)
Calcium: 9.3 mg/dL (ref 8.9–10.3)
Chloride: 106 mmol/L (ref 98–111)
Creatinine, Ser: 0.56 mg/dL (ref 0.44–1.00)
GFR, Estimated: >60 mL/min (ref >60)
Glucose, Bld: 137 mg/dL — ABNORMAL HIGH (ref 70–99)
Potassium: 3.8 mmol/L (ref 3.5–5.1)
Sodium: 141 mmol/L (ref 135–145)

## 2020-07-29 LAB — GLUCOSE, CAPILLARY
Glucose-Capillary: 128 mg/dL — ABNORMAL HIGH (ref 70–99)
Glucose-Capillary: 101 mg/dL — ABNORMAL HIGH (ref 70–99)
Glucose-Capillary: 102 mg/dL — ABNORMAL HIGH (ref 70–99)
Glucose-Capillary: 117 mg/dL — ABNORMAL HIGH (ref 70–99)
Glucose-Capillary: 106 mg/dL — ABNORMAL HIGH (ref 70–99)
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
## 2020-07-29 NOTE — Plan of Care (Signed)
  Problem: Education: Goal: Knowledge of General Education information will improve Description: Including pain rating scale, medication(s)/side effects and non-pharmacologic comfort measures Outcome: Progressing   Problem: Health Behavior/Discharge Planning: Goal: Ability to manage health-related needs will improve Outcome: Progressing   Problem: Clinical Measurements: Goal: Ability to maintain clinical measurements within normal limits will improve Outcome: Progressing Goal: Will remain free from infection Outcome: Progressing Goal: Diagnostic test results will improve Outcome: Progressing Goal: Respiratory complications will improve Outcome: Progressing Goal: Cardiovascular complication will be avoided Outcome: Progressing   Problem: Activity: Goal: Risk for activity intolerance will decrease Outcome: Progressing   Problem: Nutrition: Goal: Adequate nutrition will be maintained Outcome: Progressing   Problem: Coping: Goal: Level of anxiety will decrease Outcome: Progressing   Problem: Elimination: Goal: Will not experience complications related to bowel motility Outcome: Progressing Goal: Will not experience complications related to urinary retention Outcome: Progressing   Problem: Pain Managment: Goal: General experience of comfort will improve Outcome: Progressing   Problem: Safety: Goal: Ability to remain free from injury will improve Outcome: Progressing   Problem: Skin Integrity: Goal: Risk for impaired skin integrity will decrease Outcome: Progressing   Problem: Education: Goal: Knowledge of the prescribed therapeutic regimen Outcome: Progressing Goal: Knowledge of disease or condition will improve Outcome: Progressing   Problem: Clinical Measurements: Goal: Neurologic status will improve Outcome: Progressing   Problem: Tissue Perfusion: Goal: Ability to maintain intracranial pressure will improve Outcome: Progressing   Problem:  Respiratory: Goal: Will regain and/or maintain adequate ventilation Outcome: Progressing   Problem: Skin Integrity: Goal: Risk for impaired skin integrity will decrease Outcome: Progressing Goal: Demonstration of wound healing without infection will improve Outcome: Progressing   Problem: Psychosocial: Goal: Ability to verbalize positive feelings about self will improve Outcome: Progressing Goal: Ability to participate in self-care as condition permits will improve Outcome: Progressing Goal: Ability to identify appropriate support needs will improve Outcome: Progressing   Problem: Health Behavior/Discharge Planning: Goal: Ability to manage health-related needs will improve Outcome: Progressing   Problem: Nutritional: Goal: Risk of aspiration will decrease Outcome: Progressing Goal: Dietary intake will improve Outcome: Progressing   Problem: Communication: Goal: Ability to communicate needs accurately will improve Outcome: Progressing   Problem: Activity: Goal: Ability to tolerate increased activity will improve Outcome: Progressing   Problem: Respiratory: Goal: Ability to maintain a clear airway and adequate ventilation will improve Outcome: Progressing   Problem: Role Relationship: Goal: Method of communication will improve Outcome: Progressing   

## 2020-07-30 LAB — GLUCOSE, CAPILLARY
Glucose-Capillary: 103 mg/dL — ABNORMAL HIGH (ref 70–99)
Glucose-Capillary: 111 mg/dL — ABNORMAL HIGH (ref 70–99)
Glucose-Capillary: 121 mg/dL — ABNORMAL HIGH (ref 70–99)
Glucose-Capillary: 124 mg/dL — ABNORMAL HIGH (ref 70–99)
Glucose-Capillary: 127 mg/dL — ABNORMAL HIGH (ref 70–99)
Glucose-Capillary: 80 mg/dL (ref 70–99)
Glucose-Capillary: 92 mg/dL (ref 70–99)
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
## 2020-07-30 NOTE — Progress Notes (Signed)
Inpatient Rehabilitation-Admissions Coordinator   [facility] continues to work on [employer] barriers. TOC team aware.   Cheri Rous, OTR/L  Rehab Admissions Coordinator  [phone] 07/30/2020 11:59 AM
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
## 2020-07-30 NOTE — Progress Notes (Signed)
Complete bed change and bath given. Pt helped in rolling to left side, but unable to assist in rolling to right side. New pure wick placed.

## 2020-07-30 NOTE — Progress Notes (Signed)
Complete bed linen change and incontinent care provided. Small soft BM noted

## 2020-07-31 LAB — GLUCOSE, CAPILLARY
Glucose-Capillary: 108 mg/dL — ABNORMAL HIGH (ref 70–99)
Glucose-Capillary: 109 mg/dL — ABNORMAL HIGH (ref 70–99)
Glucose-Capillary: 110 mg/dL — ABNORMAL HIGH (ref 70–99)
Glucose-Capillary: 110 mg/dL — ABNORMAL HIGH (ref 70–99)
Glucose-Capillary: 110 mg/dL — ABNORMAL HIGH (ref 70–99)
Glucose-Capillary: 115 mg/dL — ABNORMAL HIGH (ref 70–99)
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
## 2020-08-01 LAB — GLUCOSE, CAPILLARY
Glucose-Capillary: 128 mg/dL — ABNORMAL HIGH (ref 70–99)
Glucose-Capillary: 113 mg/dL — ABNORMAL HIGH (ref 70–99)
Glucose-Capillary: 140 mg/dL — ABNORMAL HIGH (ref 70–99)
Glucose-Capillary: 102 mg/dL — ABNORMAL HIGH (ref 70–99)
Glucose-Capillary: 129 mg/dL — ABNORMAL HIGH (ref 70–99)
Glucose-Capillary: 91 mg/dL (ref 70–99)
Glucose-Capillary: 125 mg/dL — ABNORMAL HIGH (ref 70–99)
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
## 2020-08-02 LAB — GLUCOSE, CAPILLARY
Glucose-Capillary: 100 mg/dL — ABNORMAL HIGH (ref 70–99)
Glucose-Capillary: 119 mg/dL — ABNORMAL HIGH (ref 70–99)
Glucose-Capillary: 129 mg/dL — ABNORMAL HIGH (ref 70–99)
Glucose-Capillary: 134 mg/dL — ABNORMAL HIGH (ref 70–99)
Glucose-Capillary: 89 mg/dL (ref 70–99)
Glucose-Capillary: 96 mg/dL (ref 70–99)

## 2020-08-02 NOTE — Plan of Care (Signed)
  Problem: Education: Goal: Knowledge of General Education information will improve Description: Including pain rating scale, medication(s)/side effects and non-pharmacologic comfort measures Outcome: Not Progressing   
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
## 2020-08-02 NOTE — Consult Note (Addendum)
WOC Nurse wound follow up: Left neck/mandible with previously noted MDRPIfrom previous C collar, which is no longer in place. Wound OVA:NVBT dry stage 2 pressure injury; 2X2X.1cm; no odor or drainage.  No further role for topical treatment. Please re-consult if further assistance is needed.  Thank-you,  Cammie Mcgee MSN, RN, CWOCN, [place], CNS [phone]
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
## 2020-08-03 LAB — GLUCOSE, CAPILLARY
Glucose-Capillary: 100 mg/dL — ABNORMAL HIGH (ref 70–99)
Glucose-Capillary: 108 mg/dL — ABNORMAL HIGH (ref 70–99)
Glucose-Capillary: 112 mg/dL — ABNORMAL HIGH (ref 70–99)
Glucose-Capillary: 124 mg/dL — ABNORMAL HIGH (ref 70–99)
Glucose-Capillary: 125 mg/dL — ABNORMAL HIGH (ref 70–99)
Glucose-Capillary: 129 mg/dL — ABNORMAL HIGH (ref 70–99)
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
## 2020-08-03 NOTE — TOC Progression Note (Signed)
Transition of Care [facility]) - Progression Note    Patient Details  Name: Tami Brown MRN: [id number] Date of Birth: 1995-11-08  [facility] of [facility]) CM/SW Contact  Glennon Mac, RN Phone Number: 08/03/2020, 3:33 PM  Clinical Narrative:   Appreciate continued efforts by Kathee Delton Rehab admission coordinator to work through difficulties with pt's insurance.  Hopeful for rehab soon.     Expected Discharge Plan: IP Rehab Facility Barriers to Discharge: Continued Medical Work up  Expected Discharge Plan and Services Expected Discharge Plan: IP Rehab Facility   Discharge Planning Services: CM Consult   Living arrangements for the past 2 months: Single Family Home                                       Social Determinants of Health (SDOH) Interventions    Readmission Risk Interventions No flowsheet data found.  Quintella Baton, RN, BSN  Trauma/Neuro ICU Case Manager [phone]

## 2020-08-03 NOTE — Progress Notes (Signed)
Inpatient Rehabilitation-Admissions Coordinator   [facility] continues to work through insurance barriers. TOC team updated.   Cheri Rous, OTR/L  Rehab Admissions Coordinator  [phone] 08/03/2020 9:30 AM
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
## 2020-08-04 LAB — GLUCOSE, CAPILLARY
Glucose-Capillary: 97 mg/dL (ref 70–99)
Glucose-Capillary: 115 mg/dL — ABNORMAL HIGH (ref 70–99)
Glucose-Capillary: 129 mg/dL — ABNORMAL HIGH (ref 70–99)
Glucose-Capillary: 104 mg/dL — ABNORMAL HIGH (ref 70–99)
Glucose-Capillary: 101 mg/dL — ABNORMAL HIGH (ref 70–99)
Glucose-Capillary: 95 mg/dL (ref 70–99)
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
## 2020-08-05 LAB — GLUCOSE, CAPILLARY
Glucose-Capillary: 98 mg/dL (ref 70–99)
Glucose-Capillary: 106 mg/dL — ABNORMAL HIGH (ref 70–99)
Glucose-Capillary: 98 mg/dL (ref 70–99)
Glucose-Capillary: 105 mg/dL — ABNORMAL HIGH (ref 70–99)
Glucose-Capillary: 99 mg/dL (ref 70–99)
Glucose-Capillary: 108 mg/dL — ABNORMAL HIGH (ref 70–99)
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
## 2020-08-05 NOTE — Progress Notes (Addendum)
Inpatient Rehab Admissions Coordinator:   I do not have a CIR bed available for Pt. Today. I have not received a decision from her insurance. CIR will continue to pursue CIR admission pending insurance authorization and bed availability.   Megan Salon, MS, CCC-SLP Rehab Admissions Coordinator  [phone] (celll) [phone] (office)
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
## 2020-08-05 NOTE — Progress Notes (Signed)
Per Dr. Blima Singer order, pt's trach was capped, pt tolerated well. Janina Mayo will remain capped for 48hrs as long as pt tolerates well.

## 2020-08-06 LAB — GLUCOSE, CAPILLARY
Glucose-Capillary: 90 mg/dL (ref 70–99)
Glucose-Capillary: 121 mg/dL — ABNORMAL HIGH (ref 70–99)
Glucose-Capillary: 109 mg/dL — ABNORMAL HIGH (ref 70–99)
Glucose-Capillary: 92 mg/dL (ref 70–99)
Glucose-Capillary: 93 mg/dL (ref 70–99)
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
## 2020-08-06 NOTE — Progress Notes (Signed)
Patient restless throughout evening. Father called for update, states "she is usually in pain when she gets restless," requested patient be medicated for pain. Oxycodone given as requested. Patient fell asleep for 2 hrs and awaken to continue same behavior. Kicking foot of bed. RN attempted to pad footboard for protection however patient will move pillows and pads with her foot and continue to kick the footboard. RN redirecting, providing distraction and care helps improve behavior but does not eliminate it.
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
## 2020-08-06 NOTE — Progress Notes (Signed)
Order for trach change for pt. Will hold off on trach change at this time due to pt is capped. Pt will remain capped for 48 hrs then possible decannulation.

## 2020-08-07 LAB — GLUCOSE, CAPILLARY
Glucose-Capillary: 107 mg/dL — ABNORMAL HIGH (ref 70–99)
Glucose-Capillary: 114 mg/dL — ABNORMAL HIGH (ref 70–99)
Glucose-Capillary: 121 mg/dL — ABNORMAL HIGH (ref 70–99)
Glucose-Capillary: 122 mg/dL — ABNORMAL HIGH (ref 70–99)
Glucose-Capillary: 124 mg/dL — ABNORMAL HIGH (ref 70–99)
Glucose-Capillary: 94 mg/dL (ref 70–99)

## 2020-08-07 NOTE — Procedures (Signed)
Pt was decannulated per MD order to room air, 99% SP02, HR 99, RR 15, no respiratory distress noted.  Stoma care completed, RN at the bedside.
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
## 2020-08-08 LAB — GLUCOSE, CAPILLARY
Glucose-Capillary: 101 mg/dL — ABNORMAL HIGH (ref 70–99)
Glucose-Capillary: 119 mg/dL — ABNORMAL HIGH (ref 70–99)
Glucose-Capillary: 134 mg/dL — ABNORMAL HIGH (ref 70–99)
Glucose-Capillary: 97 mg/dL (ref 70–99)
Glucose-Capillary: 103 mg/dL — ABNORMAL HIGH (ref 70–99)
Glucose-Capillary: 109 mg/dL — ABNORMAL HIGH (ref 70–99)
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
## 2020-08-09 LAB — GLUCOSE, CAPILLARY
Glucose-Capillary: 102 mg/dL — ABNORMAL HIGH (ref 70–99)
Glucose-Capillary: 105 mg/dL — ABNORMAL HIGH (ref 70–99)
Glucose-Capillary: 112 mg/dL — ABNORMAL HIGH (ref 70–99)
Glucose-Capillary: 84 mg/dL (ref 70–99)
Glucose-Capillary: 85 mg/dL (ref 70–99)
Glucose-Capillary: 85 mg/dL (ref 70–99)
Glucose-Capillary: 91 mg/dL (ref 70–99)

## 2020-08-09 NOTE — Plan of Care (Signed)
  Problem: Clinical Measurements: Goal: Ability to maintain clinical measurements within normal limits will improve Outcome: Progressing   
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
## 2020-08-09 NOTE — TOC CAGE-AID Note (Signed)
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      Quintella Baton, RN, BSN  Trauma/Neuro ICU Case Manager [phone]

## 2020-08-09 NOTE — Progress Notes (Signed)
Inpatient Rehabilitation-Admissions Coordinator   [facility] continuing to follow. I am receiveing assistance from the [facility] in dealing with this insurance case.   Cheri Rous, OTR/L  Rehab Admissions Coordinator  [phone] 08/09/2020 12:38 PM
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
## 2020-08-09 NOTE — Progress Notes (Signed)
This nurse assessed for PIV placement. This patient has frequent/multiple PIV restarts. Discussed POC with Dustin Flock RN. Instructed to follow up with pts. Care team regarding PIV and need for vascular access. Tomasita Morrow, RN VAST

## 2020-08-10 LAB — BASIC METABOLIC PANEL
Anion gap: 12 (ref 5–15)
BUN: 9 mg/dL (ref 6–20)
CO2: 20 mmol/L — ABNORMAL LOW (ref 22–32)
Calcium: 9.1 mg/dL (ref 8.9–10.3)
Chloride: 105 mmol/L (ref 98–111)
Creatinine, Ser: 0.53 mg/dL (ref 0.44–1.00)
GFR, Estimated: >60 mL/min (ref >60)
Glucose, Bld: 129 mg/dL — ABNORMAL HIGH (ref 70–99)
Potassium: 3.7 mmol/L (ref 3.5–5.1)
Sodium: 137 mmol/L (ref 135–145)

## 2020-08-10 LAB — CBC
HCT: 41 % (ref 36.0–46.0)
Hemoglobin: 14.2 g/dL (ref 12.0–15.0)
MCH: 32.7 pg (ref 26.0–34.0)
MCHC: 34.6 g/dL (ref 30.0–36.0)
MCV: 94.5 fL (ref 80.0–100.0)
Platelets: 333 10*3/uL (ref 150–400)
RBC: 4.34 MIL/uL (ref 3.87–5.11)
RDW: 12.8 % (ref 11.5–15.5)
WBC: 7.9 10*3/uL (ref 4.0–10.5)
nRBC: 0 % (ref 0.0–0.2)

## 2020-08-10 LAB — GLUCOSE, CAPILLARY
Glucose-Capillary: 100 mg/dL — ABNORMAL HIGH (ref 70–99)
Glucose-Capillary: 109 mg/dL — ABNORMAL HIGH (ref 70–99)
Glucose-Capillary: 111 mg/dL — ABNORMAL HIGH (ref 70–99)
Glucose-Capillary: 117 mg/dL — ABNORMAL HIGH (ref 70–99)
Glucose-Capillary: 138 mg/dL — ABNORMAL HIGH (ref 70–99)
Glucose-Capillary: 87 mg/dL (ref 70–99)

## 2020-08-10 LAB — MAGNESIUM: Magnesium: 1.7 mg/dL (ref 1.7–2.4)
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
## 2020-08-11 LAB — GLUCOSE, CAPILLARY
Glucose-Capillary: 122 mg/dL — ABNORMAL HIGH (ref 70–99)
Glucose-Capillary: 106 mg/dL — ABNORMAL HIGH (ref 70–99)
Glucose-Capillary: 110 mg/dL — ABNORMAL HIGH (ref 70–99)
Glucose-Capillary: 97 mg/dL (ref 70–99)
Glucose-Capillary: 120 mg/dL — ABNORMAL HIGH (ref 70–99)

## 2020-08-11 NOTE — Progress Notes (Signed)
Inpatient Rehabilitation-Admissions Coordinator   Received computer access to insurance portal. Pt's insurance case was finally able to be initiated!! Clinicals and info sheet faxed. Will follow up as soon as there is a determination.   Cheri Rous, OTR/L  Rehab Admissions Coordinator  [phone] 08/11/2020 4:51 PM
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
## 2020-08-12 ENCOUNTER — Inpatient Hospital Stay (HOSPITAL_COMMUNITY): Payer: Medicaid - Out of State

## 2020-08-12 LAB — GLUCOSE, CAPILLARY
Glucose-Capillary: 101 mg/dL — ABNORMAL HIGH (ref 70–99)
Glucose-Capillary: 110 mg/dL — ABNORMAL HIGH (ref 70–99)
Glucose-Capillary: 110 mg/dL — ABNORMAL HIGH (ref 70–99)
Glucose-Capillary: 121 mg/dL — ABNORMAL HIGH (ref 70–99)
Glucose-Capillary: 125 mg/dL — ABNORMAL HIGH (ref 70–99)
Glucose-Capillary: 77 mg/dL (ref 70–99)
Glucose-Capillary: 92 mg/dL (ref 70–99)

## 2020-08-12 MED ORDER — IOPAMIDOL (ISOVUE-300) INJECTION 61%: 30.0000 mL | Freq: Once | INTRAVENOUS | Status: DC | PRN

## 2020-08-12 NOTE — Discharge Instructions (Signed)
Living With Traumatic Brain Injury Traumatic brain injury (TBI) is an injury to the brain that may be mild, moderate, or severe. Symptoms of any type of TBI can be long lasting (chronic). Depending on the area of the brain that is affected, a TBI can interfere with vision, memory, concentration, speech, balance, sense of touch, and sleep. TBI can also cause chronic symptoms like headache or dizziness. How to cope with lifestyle changes After a TBI, you may need to make changes to your lifestyle in order to recover as well as possible. How quickly and how fully you recover will depend on the severity of your injury. Your recovery plan may involve:  Working with specialists to develop a rehabilitation plan to help you return to your regular activities. Your health care team may include: ? Physical or occupational therapists. ? Speech and language pathologists. ? Mental health counselors. ? Physicians like your primary care physician or neurologist.  Taking time off work or school, depending on your injury.  Avoiding situations where there is a risk for another head injury, such as football, hockey, soccer, basketball, martial arts, downhill snow sports, and horseback riding. Do not do these activities until your health care provider approves.  Resting. Rest helps the brain to heal. Make sure you: ? Get plenty of sleep at night. Avoid staying up late at night. ? Keep the same bedtime hours on weekends and weekdays. ? Rest during the day. Take daytime naps or rest breaks when you feel tired.  Avoiding extra stress on your eyes. You may need to set time limits when working on the computer, watching TV, and reading.  Finding ways to manage stress. This may include: ? Avoiding activities that cause stress. ? Deep breathing, yoga, or meditation. ? Listening to music or spending time outdoors.  Making lists, setting reminders, or using a day planner to help your memory.  Allowing yourself plenty  of time to complete everyday tasks, such as grocery shopping, paying bills, and doing laundry.  Avoiding driving. Your ability to drive safely may be affected by your injury. ? Rely on family, friends, or a transportation service to help you get around and to appointments. ? Have a professional evaluation to check your driving ability. ? Access support services to help you return to driving. These may include training and adaptive equipment. Follow these instructions at home:  Take over-the-counter and prescription medicines only as told by your health care provider. Do not take aspirin or other anti-inflammatory medicines such as ibuprofen or naproxen unless approved by your health care provider.  Avoid large amounts of caffeine. Your body may be more sensitive to it after your injury.  Do not use any products that contain nicotine or tobacco, such as cigarettes, e-cigarettes, nicotine gum, and patches. If you need help quitting, ask your health care provider.  Do not use drugs.  Limit alcohol intake to no more than 1 drink per day for nonpregnant women and 2 drinks per day for men. One drink equals 12 ounces of beer, 5 ounces of wine, or 1 ounces of hard liquor.  Do not drive until cleared by your health care provider.  Keep all follow-up visits as told by your health care provider. This is important. Where to find support  Talk with your employer, co-workers, teachers, or school counselor about your injury. Work together to develop a plan for completing tasks while you recover.  Talk to others living with a TBI. Join a support group with other people   who have experienced a TBI.  Let your friends and family members know what they can do to help. This might include helping at home or transportation to appointments.  If you are unable to continue working after your injury, talk to a social worker about options to help you meet your financial needs.  Seek out additional resources if  you are a military serviceman or family member, such as: ? [facility]: dvbic.dcoe.mil ? [facility] [employer] Military and Veterans Crisis Line: 1-800-273-8255 Questions to ask your health care provider:  How serious is my injury?  What is my rehabilitation plan?  What is my expected recovery?  When can I return to work or school?  When can I return to regular activities, including driving? Contact a health care provider if:  You have new or worsening: ? Dizziness. ? Headache. ? Anxiety or depression. ? Irritability. ? Confusion. ? Jerky movements that you cannot control (seizures). ? Extreme sensitivity to light or sound. ? Nausea or vomiting. Summary  Traumatic brain injury (TBI) is an injury to your brain that can interfere with vision, memory, concentration, speech, balance, sense of touch, and sleep. TBI can also cause chronic symptoms like headache or dizziness.  After a TBI you may need to make several changes to your lifestyle in order to recover as well as possible. How quickly and how fully you recover will depend on the severity of your injury.  Talk to your family, friends, employer, co-workers, teachers, or school counselor about your injury. Work together to develop a plan for completing tasks while you recover. This information is not intended to replace advice given to you by your health care provider. Make sure you discuss any questions you have with your health care provider. Document Revised: 12/13/2018 Document Reviewed: 08/17/2016 [employer] Patient Education  2020 [employer]  
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
## 2020-08-12 NOTE — Progress Notes (Signed)
Inpatient Rehab Admissions Coordinator:   I do not have a CIR bed available for this pt. Today and continue to await decision from her insurance. [facility] team will continue to follow for potential admit pending insurance auth and bed availability.   Megan Salon, MS, CCC-SLP Rehab Admissions Coordinator  [phone] (celll) [phone] (office)
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
## 2020-08-12 NOTE — Care Management (Signed)
Received voicemail from [place] with [employer], requesting information; phone [phone].   Called back; left voicemail.   Quintella Baton, RN, BSN  Trauma/Neuro ICU Case Manager [phone]

## 2020-08-12 NOTE — Progress Notes (Signed)
When RN entered room, tube feeding was disconnected from PEG tube. PEG tubing appeared to have more exposed than earlier but PEG not completely out. Tube feeding stopped. PA paged. PA to bedside and new order for contrast to check for placement.

## 2020-08-13 ENCOUNTER — Inpatient Hospital Stay (HOSPITAL_COMMUNITY): Payer: Medicaid - Out of State

## 2020-08-13 LAB — GLUCOSE, CAPILLARY
Glucose-Capillary: 127 mg/dL — ABNORMAL HIGH (ref 70–99)
Glucose-Capillary: 117 mg/dL — ABNORMAL HIGH (ref 70–99)
Glucose-Capillary: 111 mg/dL — ABNORMAL HIGH (ref 70–99)
Glucose-Capillary: 101 mg/dL — ABNORMAL HIGH (ref 70–99)
Glucose-Capillary: 111 mg/dL — ABNORMAL HIGH (ref 70–99)
Glucose-Capillary: 118 mg/dL — ABNORMAL HIGH (ref 70–99)
Glucose-Capillary: 110 mg/dL — ABNORMAL HIGH (ref 70–99)

## 2020-08-13 NOTE — Progress Notes (Signed)
IP rehab admissions - no beds available today or over the weekend for this patient.  Awaiting insurance authorization prior to admission to CIR.  Will follow up on Monday for plans.  Call for questions.  [phone]
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
## 2020-08-13 NOTE — Progress Notes (Signed)
Modified Barium Swallow Progress Note  Patient Details  Name: Tami Brown MRN: [id number] Date of Birth: 1995-09-07  Today's Date: 08/13/2020  Modified Barium Swallow completed.  Full report located under Chart Review in the Imaging Section.  Brief recommendations include the following:  Clinical Impression  Pt has a neurogenic oropharyngeal dysphagia with significant oral deficits as well as decreased airway protection with all consistencies. She has a lot of lingual pumping, reduced labial seal, and limtied lingual control for cohesion and containment. Boluses start spilling into the pharynx as she is working on trying to clear her oral cavity. She silently aspirates thin liquids before the swallow as her pyriform sinuses begin to fill. Although she doesn't cough, this is associated with facial grimacing that has been observed at bedside during PO trials. Pt also has reduced velopharyngeal closure, hyolaryngeal elevation/excursion, laryngeal vestibule closure, base of tongue retraction, and epiglottic inversion. There is trace penetration that occurs during the swallow with thickened liquids and purees. Aspiration happens less consistently, but some of the penetrates are also aspirated over time when pt cannot clear her airway with a cued cough. Recommend that she continue to receive her nutrition via alternative means, but may initiate PO trials of honey thick liquids via spoon within SLP visits as part of an overall exercise program to rehabilitate her swallowing.   Swallow Evaluation Recommendations   SLP Diet Recommendations: NPO;Alternative means - long-term   Medication Administration: Via alternative means   Oral Care Recommendations: Oral care QID   Other Recommendations: Have oral suction available    Mahala Menghini., M.A. CCC-SLP Acute Rehabilitation Services Pager [phone] Office [phone]  08/13/2020,2:11 PM

## 2020-08-14 LAB — GLUCOSE, CAPILLARY
Glucose-Capillary: 111 mg/dL — ABNORMAL HIGH (ref 70–99)
Glucose-Capillary: 106 mg/dL — ABNORMAL HIGH (ref 70–99)
Glucose-Capillary: 121 mg/dL — ABNORMAL HIGH (ref 70–99)
Glucose-Capillary: 89 mg/dL (ref 70–99)
Glucose-Capillary: 108 mg/dL — ABNORMAL HIGH (ref 70–99)
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
## 2020-08-15 LAB — GLUCOSE, CAPILLARY
Glucose-Capillary: 106 mg/dL — ABNORMAL HIGH (ref 70–99)
Glucose-Capillary: 107 mg/dL — ABNORMAL HIGH (ref 70–99)
Glucose-Capillary: 109 mg/dL — ABNORMAL HIGH (ref 70–99)
Glucose-Capillary: 111 mg/dL — ABNORMAL HIGH (ref 70–99)
Glucose-Capillary: 114 mg/dL — ABNORMAL HIGH (ref 70–99)
Glucose-Capillary: 130 mg/dL — ABNORMAL HIGH (ref 70–99)
Glucose-Capillary: 94 mg/dL (ref 70–99)
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
## 2020-08-16 ENCOUNTER — Inpatient Hospital Stay (HOSPITAL_COMMUNITY): Payer: Medicaid - Out of State

## 2020-08-16 LAB — GLUCOSE, CAPILLARY
Glucose-Capillary: 108 mg/dL — ABNORMAL HIGH (ref 70–99)
Glucose-Capillary: 109 mg/dL — ABNORMAL HIGH (ref 70–99)
Glucose-Capillary: 118 mg/dL — ABNORMAL HIGH (ref 70–99)
Glucose-Capillary: 126 mg/dL — ABNORMAL HIGH (ref 70–99)
Glucose-Capillary: 74 mg/dL (ref 70–99)
Glucose-Capillary: 81 mg/dL (ref 70–99)

## 2020-08-16 NOTE — Progress Notes (Signed)
Occupational Therapy Note  Pt found supine in floor by sink in room with bed alarm sounding and all four bedrails up. Calling PT and RN to room. Assist pt safely back to bed. Pt denies pain. Leaving pt supine in bed, bed alarm set, bedrails up, and RN at bedside.   Elenora Hawbaker MSOT, OTR/L Acute Rehab Pager: [phone] Office: [phone]
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
## 2020-08-16 NOTE — Progress Notes (Addendum)
Pt found by OT to be lying on the floor with bed alarm going off and all four bed rails up. Pt near sink and cabinet area. OT called for help and she, along with this RN and PT successfully transferred pt back to bed. Pt refusing speech, but nods to answer questions. She inconsistently nods yes and no when asked if she hit her head. She also did point to her head at times when asked if it hurt or if she hit it, but also nods that she is not in pain. Pt unreliable at this time and is difficult to assess how and what happened when she fell. Dr. Bedelia Person was text-paged and placed orders for CT of head. However, she did not and does not appear to be in any distress and was not calling out or crying out when on floor. After further evaluation it was found that call system was not plugged in, so although bed was alarming, alarm was not going overhead. Unit AD notified of incident as well.   1900: At shift changed, night staff notified of pt fall, and primary RN for pt encouraged to check call system each time she is in the room as it is likely that pt pulled cord out. This RN called pt's mother and updated her on pt fall.  2015: Safety zone submitted by this RN and all post-fall documentation complete.  Robina Ade, RN

## 2020-08-17 LAB — GLUCOSE, CAPILLARY
Glucose-Capillary: 116 mg/dL — ABNORMAL HIGH (ref 70–99)
Glucose-Capillary: 125 mg/dL — ABNORMAL HIGH (ref 70–99)
Glucose-Capillary: 109 mg/dL — ABNORMAL HIGH (ref 70–99)
Glucose-Capillary: 111 mg/dL — ABNORMAL HIGH (ref 70–99)
Glucose-Capillary: 86 mg/dL (ref 70–99)
Glucose-Capillary: 115 mg/dL — ABNORMAL HIGH (ref 70–99)
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
## 2020-08-17 NOTE — Progress Notes (Signed)
Pt's mother called to check in on pt. This RN updated the mother. She would also like an update from the MD, as she says that she has not had one recently.  Robina Ade, RN

## 2020-08-17 NOTE — Progress Notes (Signed)
Pt's mother called this am requesting overnight update after pt had fall yesterday. Update given with no further questions. Pt's mother states she will be visiting later today.   Robina Ade, RN

## 2020-08-18 LAB — GLUCOSE, CAPILLARY
Glucose-Capillary: 102 mg/dL — ABNORMAL HIGH (ref 70–99)
Glucose-Capillary: 115 mg/dL — ABNORMAL HIGH (ref 70–99)
Glucose-Capillary: 118 mg/dL — ABNORMAL HIGH (ref 70–99)
Glucose-Capillary: 134 mg/dL — ABNORMAL HIGH (ref 70–99)
Glucose-Capillary: 79 mg/dL (ref 70–99)
Glucose-Capillary: 110 mg/dL — ABNORMAL HIGH (ref 70–99)

## 2020-08-18 MED ORDER — OSMOLITE 1.5 CAL PO LIQD
360.0000 mL | Freq: Four times a day (QID) | ORAL | Status: DC
  Administered 2020-08-18 – 2020-08-19 (×2): 360 mL
  Administered 2020-08-19: 240 mL
  Administered 2020-08-19 – 2020-08-24 (×17): 360 mL
  Filled 2020-08-18 (×25): qty 474

## 2020-08-18 MED ORDER — FREE WATER
200.0000 mL | Freq: Four times a day (QID) | Status: DC
  Administered 2020-08-19 – 2020-08-23 (×18): 200 mL

## 2020-08-18 NOTE — Plan of Care (Signed)
  Problem: Education: Goal: Knowledge of General Education information will improve Description Including pain rating scale, medication(s)/side effects and non-pharmacologic comfort measures Outcome: Progressing   

## 2020-08-18 NOTE — Progress Notes (Signed)
Inpatient Rehabilitation-Admissions Coordinator   I have insurance approval for admit to CIR. However, pt does not have out-of-network benefits. Currently working on a single-case Production manager with the [employer]. Hoping for that to be completed in the next day or so. Will follow up as soon as that process has been completed. TOC team and pt's mother updated on status.   Cheri Rous, OTR/L  Rehab Admissions Coordinator  [phone] 08/18/2020 12:41 PM

## 2020-08-18 NOTE — Progress Notes (Signed)
RN unable to place patient in Enclosure bed due to missing part, portable equipment is assessing bed. Non violent restraints assessment not charted on by this RN due to delays. Oncoming RN aware and will continue coverage of situation.

## 2020-08-18 NOTE — Progress Notes (Signed)
RN spoke to RD about changing continuous Osmolite feeding to bolus feeds in order to use enclosure bed. Current infusion just completed and RN will not restart continuous feed until new orders received.  Oncall MD made aware patient has to come off telemetry and CPO in order to use enclosure bed.    Patient also educated about unsafe behaviors and reasoning to place her in enclosure bed. She understands without difficulty and will update her mother.
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
## 2020-08-18 NOTE — Progress Notes (Signed)
This nurse started patient on bolus Osmolite 1.5 tube feeds per order instructions (see mar).
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
## 2020-08-18 NOTE — Progress Notes (Signed)
Patient has attempted to get out of bed approximately 6 times without assistance. Due to recent fall on Monday. Patient qualifies for Telesitter to help redirect patient when attempting to get out of bed. Trauma PA made aware. New orders being placed. RN will continue to monitor.

## 2020-08-18 NOTE — Progress Notes (Signed)
[id number] nurse was told from portable equipment that there was no additional parts to add to bed. This nurse along with NT transferred patient to enclosure bed. Patient responding well to bed. Will continue to monitor.
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
## 2020-08-19 LAB — GLUCOSE, CAPILLARY
Glucose-Capillary: 116 mg/dL — ABNORMAL HIGH (ref 70–99)
Glucose-Capillary: 159 mg/dL — ABNORMAL HIGH (ref 70–99)
Glucose-Capillary: 88 mg/dL (ref 70–99)
Glucose-Capillary: 90 mg/dL (ref 70–99)
Glucose-Capillary: 91 mg/dL (ref 70–99)
Glucose-Capillary: 92 mg/dL (ref 70–99)
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
## 2020-08-20 LAB — GLUCOSE, CAPILLARY
Glucose-Capillary: 114 mg/dL — ABNORMAL HIGH (ref 70–99)
Glucose-Capillary: 99 mg/dL (ref 70–99)
Glucose-Capillary: 151 mg/dL — ABNORMAL HIGH (ref 70–99)
Glucose-Capillary: 105 mg/dL — ABNORMAL HIGH (ref 70–99)
Glucose-Capillary: 124 mg/dL — ABNORMAL HIGH (ref 70–99)
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
## 2020-08-20 NOTE — Progress Notes (Signed)
Orthopedic Tech Progress Note Patient Details:  Tami Brown 1996-08-25 [id number]  Ortho Devices Type of Ortho Device: Abdominal binder Ortho Device/Splint Location: NECK Ortho Device/Splint Interventions: Ordered   Post Interventions Patient Tolerated: Other (comment) Instructions Provided: Other (comment)   Michelle Piper 08/20/2020, 3:40 PM

## 2020-08-21 LAB — GLUCOSE, CAPILLARY
Glucose-Capillary: 100 mg/dL — ABNORMAL HIGH (ref 70–99)
Glucose-Capillary: 102 mg/dL — ABNORMAL HIGH (ref 70–99)
Glucose-Capillary: 126 mg/dL — ABNORMAL HIGH (ref 70–99)
Glucose-Capillary: 150 mg/dL — ABNORMAL HIGH (ref 70–99)
Glucose-Capillary: 93 mg/dL (ref 70–99)
Glucose-Capillary: 97 mg/dL (ref 70–99)
Glucose-Capillary: 97 mg/dL (ref 70–99)
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
## 2020-08-21 NOTE — Progress Notes (Signed)
Pt's mother called for an update, this RN updated mother on pt's current condition and care and the mother had no further questions

## 2020-08-22 LAB — GLUCOSE, CAPILLARY
Glucose-Capillary: 107 mg/dL — ABNORMAL HIGH (ref 70–99)
Glucose-Capillary: 139 mg/dL — ABNORMAL HIGH (ref 70–99)
Glucose-Capillary: 84 mg/dL (ref 70–99)
Glucose-Capillary: 89 mg/dL (ref 70–99)
Glucose-Capillary: 96 mg/dL (ref 70–99)

## 2020-08-22 MED ORDER — INSULIN ASPART 100 UNIT/ML ~~LOC~~ SOLN
0.0000 [IU] | Freq: Four times a day (QID) | SUBCUTANEOUS | Status: DC
  Administered 2020-08-23 – 2020-08-31 (×7): 2 [IU] via SUBCUTANEOUS
  Administered 2020-08-31: 3 [IU] via SUBCUTANEOUS

## 2020-08-22 NOTE — Progress Notes (Signed)
Patient continues to have periods of restlessness, kicking side of bed. Patient has previous fall. Continued to reorient patient, distract with blankets, round frequently.  Patient will remain in a enclosed bed for patient safety as ordered.

## 2020-08-22 NOTE — Progress Notes (Signed)
Paged Dr. Cliffton Asters to notify that patient refused TF and meds due for 1400. NO PIV. Zofran only ordered IV. Patient vomited last TF at 1045. Dr. Cliffton Asters ordered  Reinsertion of PIV and to give Zofran IV.
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
## 2020-08-23 LAB — GLUCOSE, CAPILLARY
Glucose-Capillary: 85 mg/dL (ref 70–99)
Glucose-Capillary: 97 mg/dL (ref 70–99)
Glucose-Capillary: 133 mg/dL — ABNORMAL HIGH (ref 70–99)
Glucose-Capillary: 90 mg/dL (ref 70–99)
Glucose-Capillary: 110 mg/dL — ABNORMAL HIGH (ref 70–99)

## 2020-08-23 MED ORDER — FREE WATER
100.0000 mL | Freq: Four times a day (QID) | Status: DC
  Administered 2020-08-23 – 2020-09-03 (×41): 100 mL
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
## 2020-08-23 NOTE — Progress Notes (Signed)
Inpatient Rehab Admissions Coordinator:   Following for my colleague, Cheri Rous.  Single case agreement pending.  Will update as able.    Estill Dooms, PT, DPT Admissions Coordinator [phone] 08/23/20  12:37 PM
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
## 2020-08-24 LAB — GLUCOSE, CAPILLARY
Glucose-Capillary: 93 mg/dL (ref 70–99)
Glucose-Capillary: 93 mg/dL (ref 70–99)
Glucose-Capillary: 123 mg/dL — ABNORMAL HIGH (ref 70–99)
Glucose-Capillary: 133 mg/dL — ABNORMAL HIGH (ref 70–99)
Glucose-Capillary: 87 mg/dL (ref 70–99)
Glucose-Capillary: 84 mg/dL (ref 70–99)

## 2020-08-24 MED ORDER — OSMOLITE 1.5 CAL PO LIQD
237.0000 mL | Freq: Every day | ORAL | Status: DC
  Administered 2020-08-24 – 2020-09-03 (×55): 237 mL
  Filled 2020-08-24 (×5): qty 237
  Filled 2020-08-24: qty 948
  Filled 2020-08-24 (×6): qty 237
  Filled 2020-08-24: qty 948
  Filled 2020-08-24 (×6): qty 237
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
## 2020-08-24 NOTE — Plan of Care (Signed)
  Problem: Education: Goal: Knowledge of General Education information will improve Description: Including pain rating scale, medication(s)/side effects and non-pharmacologic comfort measures Outcome: Progressing   Problem: Health Behavior/Discharge Planning: Goal: Ability to manage health-related needs will improve Outcome: Progressing   Problem: Clinical Measurements: Goal: Ability to maintain clinical measurements within normal limits will improve Outcome: Progressing Goal: Will remain free from infection Outcome: Progressing Goal: Diagnostic test results will improve Outcome: Progressing Goal: Respiratory complications will improve Outcome: Progressing Goal: Cardiovascular complication will be avoided Outcome: Progressing   Problem: Activity: Goal: Risk for activity intolerance will decrease Outcome: Progressing   Problem: Nutrition: Goal: Adequate nutrition will be maintained Outcome: Progressing   Problem: Coping: Goal: Level of anxiety will decrease Outcome: Progressing   Problem: Elimination: Goal: Will not experience complications related to bowel motility Outcome: Progressing Goal: Will not experience complications related to urinary retention Outcome: Progressing   Problem: Pain Managment: Goal: General experience of comfort will improve Outcome: Progressing   Problem: Safety: Goal: Ability to remain free from injury will improve Outcome: Progressing   Problem: Skin Integrity: Goal: Risk for impaired skin integrity will decrease Outcome: Progressing   Problem: Education: Goal: Knowledge of the prescribed therapeutic regimen Outcome: Progressing Goal: Knowledge of disease or condition will improve Outcome: Progressing   Problem: Clinical Measurements: Goal: Neurologic status will improve Outcome: Progressing   Problem: Tissue Perfusion: Goal: Ability to maintain intracranial pressure will improve Outcome: Progressing   Problem:  Respiratory: Goal: Will regain and/or maintain adequate ventilation Outcome: Progressing   Problem: Skin Integrity: Goal: Risk for impaired skin integrity will decrease Outcome: Progressing Goal: Demonstration of wound healing without infection will improve Outcome: Progressing   Problem: Psychosocial: Goal: Ability to verbalize positive feelings about self will improve Outcome: Progressing Goal: Ability to participate in self-care as condition permits will improve Outcome: Progressing Goal: Ability to identify appropriate support needs will improve Outcome: Progressing   Problem: Health Behavior/Discharge Planning: Goal: Ability to manage health-related needs will improve Outcome: Progressing   Problem: Nutritional: Goal: Risk of aspiration will decrease Outcome: Progressing Goal: Dietary intake will improve Outcome: Progressing   Problem: Communication: Goal: Ability to communicate needs accurately will improve Outcome: Progressing   Problem: Activity: Goal: Ability to tolerate increased activity will improve Outcome: Progressing   Problem: Respiratory: Goal: Ability to maintain a clear airway and adequate ventilation will improve Outcome: Progressing   Problem: Role Relationship: Goal: Method of communication will improve Outcome: Progressing   
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
## 2020-08-24 NOTE — Progress Notes (Signed)
Pt found by NT to be vomiting/spitting up tube feed approximately 30 minutes after receiving bolus. Pt given yankauer and bag to use, as well as given Zofran. After initial episode, pt did not have any further issues. Later, when pt's father was at the bedside he said that this has occurred the past 3 days as well. Shirl Harris, RD, contacted and agreed to adjust feeds.   Robina Ade, RN
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
## 2020-08-24 NOTE — Progress Notes (Signed)
Patient continues to have periods of restlessness. Patient has previous fall. Continued to reorient patient, distract with blankets, round frequently. Patient will remain in a enclosed bed for patient safety as ordered.  
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
## 2020-08-25 LAB — GLUCOSE, CAPILLARY
Glucose-Capillary: 94 mg/dL (ref 70–99)
Glucose-Capillary: 148 mg/dL — ABNORMAL HIGH (ref 70–99)
Glucose-Capillary: 114 mg/dL — ABNORMAL HIGH (ref 70–99)
Glucose-Capillary: 142 mg/dL — ABNORMAL HIGH (ref 70–99)

## 2020-08-25 NOTE — Progress Notes (Signed)
Inpatient Rehab Admissions Coordinator:   Still no update from [facility] on single case agreement.  Cheri Rous to f/u tomorrow.   Estill Dooms, PT, DPT Admissions Coordinator [phone] 08/25/20  11:15 AM
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
## 2020-08-25 NOTE — Progress Notes (Signed)
Patient continues to have periods of restlessness. Patient has previous fall. Continued to reorient patient, distract with blankets, round frequently. Patient will remain in a enclosed bed for patient safety as ordered.
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
## 2020-08-26 LAB — GLUCOSE, CAPILLARY
Glucose-Capillary: 100 mg/dL — ABNORMAL HIGH (ref 70–99)
Glucose-Capillary: 110 mg/dL — ABNORMAL HIGH (ref 70–99)
Glucose-Capillary: 97 mg/dL (ref 70–99)
Glucose-Capillary: 99 mg/dL (ref 70–99)
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
## 2020-08-26 NOTE — Progress Notes (Signed)
NT found patient to be vomiting appox 1 hr post TF bolus. Patient given Yankauer and emesis bag. Zofran administered. RD paged. No new orders.

## 2020-08-26 NOTE — Progress Notes (Signed)
Patient continues to have periods of restlessness. Patient has previous fall. Continued to reorient patient, distract with blankets, round frequently. Patient will remain in a enclosed bed for patient safety as ordered.  

## 2020-08-27 LAB — GLUCOSE, CAPILLARY
Glucose-Capillary: 105 mg/dL — ABNORMAL HIGH (ref 70–99)
Glucose-Capillary: 110 mg/dL — ABNORMAL HIGH (ref 70–99)
Glucose-Capillary: 84 mg/dL (ref 70–99)
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
## 2020-08-27 NOTE — Progress Notes (Signed)
Inpatient Rehabilitation-Admissions Coordinator   [facility] continues to follow for completion of single case agreement, which is still pending at this time.   Will follow up next week.   Cheri Rous, OTR/L  Rehab Admissions Coordinator  [phone] 08/27/2020 12:09 PM

## 2020-08-27 NOTE — Progress Notes (Signed)
Patient received from 4N. Patient is AAOX3, does a lot of pointing and hand gestures to communicate but we encourage her to use her words, patient is currently in a posey bed. No signs of distress noted and no complaints of pain at the moment.

## 2020-08-28 LAB — GLUCOSE, CAPILLARY
Glucose-Capillary: 103 mg/dL — ABNORMAL HIGH (ref 70–99)
Glucose-Capillary: 123 mg/dL — ABNORMAL HIGH (ref 70–99)
Glucose-Capillary: 123 mg/dL — ABNORMAL HIGH (ref 70–99)
Glucose-Capillary: 81 mg/dL (ref 70–99)
Glucose-Capillary: 83 mg/dL (ref 70–99)

## 2020-08-28 MED ORDER — CHLORHEXIDINE GLUCONATE 0.12 % MT SOLN
OROMUCOSAL | Status: AC
  Administered 2020-08-29: 15 mL via OROMUCOSAL
  Filled 2020-08-28: qty 15
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
## 2020-08-29 LAB — GLUCOSE, CAPILLARY
Glucose-Capillary: 133 mg/dL — ABNORMAL HIGH (ref 70–99)
Glucose-Capillary: 126 mg/dL — ABNORMAL HIGH (ref 70–99)
Glucose-Capillary: 109 mg/dL — ABNORMAL HIGH (ref 70–99)
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
## 2020-08-30 LAB — GLUCOSE, CAPILLARY
Glucose-Capillary: 96 mg/dL (ref 70–99)
Glucose-Capillary: 89 mg/dL (ref 70–99)
Glucose-Capillary: 116 mg/dL — ABNORMAL HIGH (ref 70–99)
Glucose-Capillary: 86 mg/dL (ref 70–99)

## 2020-08-30 MED ORDER — CHLORHEXIDINE GLUCONATE 0.12 % MT SOLN
OROMUCOSAL | Status: AC
  Filled 2020-08-30: qty 15

## 2020-08-30 MED ORDER — DIPHENHYDRAMINE HCL 25 MG PO CAPS
25.0000 mg | ORAL_CAPSULE | Freq: Every evening | ORAL | Status: DC | PRN
  Administered 2020-08-30 – 2020-09-01 (×2): 25 mg via ORAL
  Filled 2020-08-30 (×2): qty 1
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
## 2020-08-30 NOTE — Progress Notes (Signed)
Patient family requested update on sleeping medication. Attempted to call patient's father, Mr. Frink, at [phone] as requested but was sent to voicemail.
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
## 2020-08-31 LAB — GLUCOSE, CAPILLARY
Glucose-Capillary: 125 mg/dL — ABNORMAL HIGH (ref 70–99)
Glucose-Capillary: 154 mg/dL — ABNORMAL HIGH (ref 70–99)
Glucose-Capillary: 84 mg/dL (ref 70–99)
Glucose-Capillary: 93 mg/dL (ref 70–99)

## 2020-08-31 MED ORDER — ONDANSETRON HCL 4 MG/5ML PO SOLN
4.0000 mg | Freq: Four times a day (QID) | ORAL | Status: DC | PRN
  Filled 2020-08-31: qty 5

## 2020-08-31 MED ORDER — MELATONIN 5 MG PO TABS: 5.0000 mg | ORAL_TABLET | Freq: Every evening | ORAL | Status: DC | PRN

## 2020-08-31 NOTE — Progress Notes (Signed)
Pt family reports she has not slept in days. I paged the on call doctor, and she got prn  Benadryl. Pt still  Unable to sleep. Paged the doctor for an alternative sleeping aid.
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
## 2020-08-31 NOTE — Progress Notes (Signed)
Inpatient Rehabilitation-Admissions Coordinator   Receiving assistance from CM at [facility] for follow up on Single Case Agreement completion. Continuing to work on this case closely. Hopeful for admit this week.   Cheri Rous, OTR/L  Rehab Admissions Coordinator  [phone] 08/31/2020 1:17 PM
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
## 2020-09-01 LAB — GLUCOSE, CAPILLARY
Glucose-Capillary: 101 mg/dL — ABNORMAL HIGH (ref 70–99)
Glucose-Capillary: 75 mg/dL (ref 70–99)
Glucose-Capillary: 79 mg/dL (ref 70–99)
Glucose-Capillary: 85 mg/dL (ref 70–99)
Glucose-Capillary: 90 mg/dL (ref 70–99)

## 2020-09-01 MED ORDER — CHLORHEXIDINE GLUCONATE 0.12 % MT SOLN
OROMUCOSAL | Status: AC
  Administered 2020-09-01: 15 mL
  Filled 2020-09-01: qty 15

## 2020-09-01 NOTE — Plan of Care (Signed)
Patient ambulated with assistance to bathroom and voided. Patient up in the chair. Patient encouraged to speak and not just point. Patient speaks softly. Patient denies pain. Patient refusing oral care and have encouraged her to brush teeth. Patient tolerating peg tube feeds. Patient cooperative. [facility] removed and patient no longer on safety restraint as of 1100. Family in to visit patient this am.   Problem: Education: Goal: Knowledge of General Education information will improve Description: Including pain rating scale, medication(s)/side effects and non-pharmacologic comfort measures Outcome: Progressing   Problem: Health Behavior/Discharge Planning: Goal: Ability to manage health-related needs will improve Outcome: Progressing   Problem: Clinical Measurements: Goal: Ability to maintain clinical measurements within normal limits will improve Outcome: Progressing Goal: Will remain free from infection Outcome: Progressing Goal: Diagnostic test results will improve Outcome: Progressing Goal: Respiratory complications will improve Outcome: Progressing Goal: Cardiovascular complication will be avoided Outcome: Progressing   Problem: Activity: Goal: Risk for activity intolerance will decrease Outcome: Progressing   Problem: Nutrition: Goal: Adequate nutrition will be maintained Outcome: Progressing   Problem: Coping: Goal: Level of anxiety will decrease Outcome: Progressing   Problem: Elimination: Goal: Will not experience complications related to bowel motility Outcome: Progressing Goal: Will not experience complications related to urinary retention Outcome: Progressing   Problem: Pain Managment: Goal: General experience of comfort will improve Outcome: Progressing   Problem: Safety: Goal: Ability to remain free from injury will improve Outcome: Progressing   Problem: Skin Integrity: Goal: Risk for impaired skin integrity will decrease Outcome:  Progressing   Problem: Education: Goal: Knowledge of the prescribed therapeutic regimen Outcome: Progressing Goal: Knowledge of disease or condition will improve Outcome: Progressing   Problem: Clinical Measurements: Goal: Neurologic status will improve Outcome: Progressing   Problem: Tissue Perfusion: Goal: Ability to maintain intracranial pressure will improve Outcome: Progressing   Problem: Respiratory: Goal: Will regain and/or maintain adequate ventilation Outcome: Progressing   Problem: Skin Integrity: Goal: Risk for impaired skin integrity will decrease Outcome: Progressing Goal: Demonstration of wound healing without infection will improve Outcome: Progressing   Problem: Psychosocial: Goal: Ability to verbalize positive feelings about self will improve Outcome: Progressing Goal: Ability to participate in self-care as condition permits will improve Outcome: Progressing Goal: Ability to identify appropriate support needs will improve Outcome: Progressing   Problem: Health Behavior/Discharge Planning: Goal: Ability to manage health-related needs will improve Outcome: Progressing   Problem: Nutritional: Goal: Risk of aspiration will decrease Outcome: Progressing Goal: Dietary intake will improve Outcome: Progressing   Problem: Communication: Goal: Ability to communicate needs accurately will improve Outcome: Progressing   Problem: Activity: Goal: Ability to tolerate increased activity will improve Outcome: Progressing   Problem: Respiratory: Goal: Ability to maintain a clear airway and adequate ventilation will improve Outcome: Progressing   Problem: Role Relationship: Goal: Method of communication will improve Outcome: Progressing
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
## 2020-09-01 NOTE — Plan of Care (Signed)
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
## 2020-09-01 NOTE — Progress Notes (Signed)
Orthopedic Tech Progress Note Patient Details:  Tami Brown 1996/01/12 [id number] Called in order to [facility] for an AFO   Patient ID: Tami Brown, female   DOB: 09/07/1995, 24 y.o.   MRN: [id number]   [name] 09/01/2020, 4:00 PM
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
## 2020-09-02 ENCOUNTER — Inpatient Hospital Stay (HOSPITAL_COMMUNITY): Payer: Medicaid - Out of State

## 2020-09-02 LAB — GLUCOSE, CAPILLARY
Glucose-Capillary: 127 mg/dL — ABNORMAL HIGH (ref 70–99)
Glucose-Capillary: 118 mg/dL — ABNORMAL HIGH (ref 70–99)
Glucose-Capillary: 112 mg/dL — ABNORMAL HIGH (ref 70–99)

## 2020-09-02 MED ORDER — ACETAMINOPHEN 500 MG PO TABS: 1000.0000 mg | ORAL_TABLET | Freq: Four times a day (QID) | ORAL | Status: DC | PRN

## 2020-09-02 MED ORDER — GUAIFENESIN 100 MG/5ML PO SOLN
10.0000 mL | ORAL | Status: DC | PRN
  Filled 2020-09-02: qty 10

## 2020-09-02 MED ORDER — MELATONIN 5 MG PO TABS
5.0000 mg | ORAL_TABLET | Freq: Every evening | ORAL | Status: DC | PRN
  Administered 2020-09-02: 5 mg
  Filled 2020-09-02: qty 1

## 2020-09-02 NOTE — Progress Notes (Addendum)
Modified Barium Swallow Progress Note  Patient Details  Name: Tami Brown MRN: [id number] Date of Birth: 09-18-1995  Today's Date: 09/02/2020  Modified Barium Swallow completed.  Full report located under Chart Review in the Imaging Section.  Brief recommendations include the following:  Clinical Impression  Pt presented with persisting yet slightly improved swallow function that continues to be marked primarily by oral phase deficits.  There is continued lingual pumping and decreased cohesion of boluses, with consistent premature and piecemeal spillage into the pharynx.  Pt did demonstrate improved laryngeal vestibule closure, however the timing of closure remained impaired, such that in one instance there was spillage of 50% of honey-thick liquid bolus into the larynx and it was grossly aspirated before the onset of laryngeal closure.  Aspiration did lead to a cough response, but the aspirate had traversed too deeply into the airway and was not ejected.  Pt immediately vomited.  Study was aborted.  Pt did not appear to be in distress. She was cleaned, gown changed and sent back to the floor.  Parents were in attendance.  We discussed MBS, results, aspiration event.  Provided encouragement to pt/family that she was making progress (improved cough response, improved laryngeal function overall) and that we would continue to work toward goals for eating by mouth.  Parents verbalized understanding.  I called nurse after study to inform her of results, aspiration event and vomiting.  Continue NPO with therapeutic PO trials (puree) with SLP only.       Swallow Evaluation Recommendations       SLP Diet Recommendations: NPO;Alternative means - long-term       Medication Administration: Via alternative means               Oral Care Recommendations: Oral care QID   Other Recommendations: Have oral suction available   Phoebe Marter L. Samson Frederic, MA CCC/SLP Acute Rehabilitation  Services Office number [phone] Pager [phone]  Blenda Mounts Laurice 09/02/2020,11:20 AM

## 2020-09-02 NOTE — Plan of Care (Signed)
  Problem: Education: Goal: Knowledge of General Education information will improve Description: Including pain rating scale, medication(s)/side effects and non-pharmacologic comfort measures Outcome: Progressing   Problem: Health Behavior/Discharge Planning: Goal: Ability to manage health-related needs will improve Outcome: Progressing   Problem: Clinical Measurements: Goal: Ability to maintain clinical measurements within normal limits will improve Outcome: Progressing Goal: Will remain free from infection Outcome: Progressing Goal: Diagnostic test results will improve Outcome: Progressing Goal: Respiratory complications will improve Outcome: Progressing Goal: Cardiovascular complication will be avoided Outcome: Progressing   Problem: Activity: Goal: Risk for activity intolerance will decrease Outcome: Progressing   Problem: Nutrition: Goal: Adequate nutrition will be maintained Outcome: Progressing   Problem: Coping: Goal: Level of anxiety will decrease Outcome: Progressing   Problem: Elimination: Goal: Will not experience complications related to bowel motility Outcome: Progressing Goal: Will not experience complications related to urinary retention Outcome: Progressing   Problem: Pain Managment: Goal: General experience of comfort will improve Outcome: Progressing   Problem: Safety: Goal: Ability to remain free from injury will improve Outcome: Progressing   Problem: Skin Integrity: Goal: Risk for impaired skin integrity will decrease Outcome: Progressing   Problem: Education: Goal: Knowledge of the prescribed therapeutic regimen Outcome: Progressing Goal: Knowledge of disease or condition will improve Outcome: Progressing   Problem: Clinical Measurements: Goal: Neurologic status will improve Outcome: Progressing   Problem: Tissue Perfusion: Goal: Ability to maintain intracranial pressure will improve Outcome: Progressing   Problem:  Respiratory: Goal: Will regain and/or maintain adequate ventilation Outcome: Progressing   Problem: Skin Integrity: Goal: Risk for impaired skin integrity will decrease Outcome: Progressing Goal: Demonstration of wound healing without infection will improve Outcome: Progressing   Problem: Psychosocial: Goal: Ability to verbalize positive feelings about self will improve Outcome: Progressing Goal: Ability to participate in self-care as condition permits will improve Outcome: Progressing Goal: Ability to identify appropriate support needs will improve Outcome: Progressing   Problem: Health Behavior/Discharge Planning: Goal: Ability to manage health-related needs will improve Outcome: Progressing   Problem: Nutritional: Goal: Risk of aspiration will decrease Outcome: Progressing Goal: Dietary intake will improve Outcome: Progressing   Problem: Communication: Goal: Ability to communicate needs accurately will improve Outcome: Progressing   Problem: Activity: Goal: Ability to tolerate increased activity will improve Outcome: Progressing   Problem: Respiratory: Goal: Ability to maintain a clear airway and adequate ventilation will improve Outcome: Progressing   Problem: Role Relationship: Goal: Method of communication will improve Outcome: Progressing   
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
## 2020-09-02 NOTE — Progress Notes (Signed)
Inpatient Rehabilitation-Admissions Coordinator   SINGLE CASE AGREEMENT COMPLETED.   This patient has been approved for admit date starting tomorrow, 09/03/20.   AC will follow up tomorrow for admit, pending medical clearance!  Cheri Rous, OTR/L  Rehab Admissions Coordinator  [phone] 09/02/2020 4:53 PM
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
## 2020-09-02 NOTE — Progress Notes (Signed)
Patient ID: Tami Brown, female   DOB: October 31, 1995, 24 y.o.   MRN: [id number] [facility] Progress Note:   63 Days Post-Op  Subjective: Mental status is patient appears alert but is nonverbal.  Complaints none. Objective: Vital signs in last 24 hours: Temp:  [98.6 F (37 C)-98.9 F (37.2 C)] 98.9 F (37.2 C) (12/30 0600) Pulse Rate:  [70-87] 85 (12/30 0600) Resp:  [16-18] 18 (12/30 0600) BP: (105-109)/(55-63) 109/55 (12/30 0600) SpO2:  [99 %-100 %] 99 % (12/30 0600)  Intake/Output from previous day: No intake/output data recorded. Intake/Output this shift: No intake/output data recorded.  Physical Exam: Work of breathing is not labored  Lab Results:  Results for orders placed or performed during the hospital encounter of 06/25/20 (from the past 48 hour(s))  Glucose, capillary     Status: Abnormal   Collection Time: 08/31/20 12:03 PM  Result Value Ref Range   Glucose-Capillary 125 (H) 70 - 99 mg/dL    Comment: Glucose reference range applies only to samples taken after fasting for at least 8 hours.  Glucose, capillary     Status: None   Collection Time: 08/31/20  5:48 PM  Result Value Ref Range   Glucose-Capillary 84 70 - 99 mg/dL    Comment: Glucose reference range applies only to samples taken after fasting for at least 8 hours.  Glucose, capillary     Status: None   Collection Time: 08/31/20 11:58 PM  Result Value Ref Range   Glucose-Capillary 75 70 - 99 mg/dL    Comment: Glucose reference range applies only to samples taken after fasting for at least 8 hours.  Glucose, capillary     Status: Abnormal   Collection Time: 09/01/20  7:01 AM  Result Value Ref Range   Glucose-Capillary 101 (H) 70 - 99 mg/dL    Comment: Glucose reference range applies only to samples taken after fasting for at least 8 hours.  Glucose, capillary     Status: None   Collection Time: 09/01/20 12:22 PM  Result Value Ref Range   Glucose-Capillary 79 70 - 99 mg/dL    Comment: Glucose  reference range applies only to samples taken after fasting for at least 8 hours.  Glucose, capillary     Status: None   Collection Time: 09/01/20  6:33 PM  Result Value Ref Range   Glucose-Capillary 85 70 - 99 mg/dL    Comment: Glucose reference range applies only to samples taken after fasting for at least 8 hours.  Glucose, capillary     Status: None   Collection Time: 09/01/20 11:55 PM  Result Value Ref Range   Glucose-Capillary 90 70 - 99 mg/dL    Comment: Glucose reference range applies only to samples taken after fasting for at least 8 hours.  Glucose, capillary     Status: Abnormal   Collection Time: 09/02/20  6:01 AM  Result Value Ref Range   Glucose-Capillary 127 (H) 70 - 99 mg/dL    Comment: Glucose reference range applies only to samples taken after fasting for at least 8 hours.    Radiology/Results: No results found.  Anti-infectives: Anti-infectives (From admission, onward)   Start     Dose/Rate Route Frequency Ordered Stop   07/07/20 1400  ceFAZolin (ANCEF) IVPB 2g/100 mL premix        2 g 200 mL/hr over 30 Minutes Intravenous Every 8 hours 07/07/20 1238 07/11/20 2208   07/05/20 1200  ceFEPIme (MAXIPIME) 2 g in sodium chloride 0.9 % 100 mL  IVPB  Status:  Discontinued        2 g 200 mL/hr over 30 Minutes Intravenous Every 8 hours 07/05/20 1058 07/07/20 1238   06/25/20 0215  Ampicillin-Sulbactam (UNASYN) 3 g in sodium chloride 0.9 % 100 mL IVPB        3 g 200 mL/hr over 30 Minutes Intravenous  Once 06/25/20 0148 06/25/20 0329      Assessment/Plan: Problem List: Patient Active Problem List   Diagnosis Date Noted  . Pressure injury of skin 07/22/2020  . Closed burst fracture of cervical vertebra (HCC) 06/30/2020  . Trauma 06/25/2020    CIR placement pending 63 Days Post-Op    LOS: 69 days   Matt B. Daphine Deutscher, MD, [facility] [phone] to reach the surgeon on call.    09/02/2020 8:31 AM

## 2020-09-03 ENCOUNTER — Encounter (HOSPITAL_COMMUNITY): Payer: Self-pay

## 2020-09-03 ENCOUNTER — Encounter (HOSPITAL_COMMUNITY): Payer: Self-pay | Admitting: Physical Medicine & Rehabilitation

## 2020-09-03 ENCOUNTER — Inpatient Hospital Stay (HOSPITAL_COMMUNITY)
Admit: 2020-09-03 | Discharge: 2020-09-24 | DRG: 945 | Disposition: A | Discharge status: Home / Self Care | Payer: Medicaid Other | Source: Intra-hospital | Attending: Physical Medicine & Rehabilitation | Admitting: Physical Medicine & Rehabilitation

## 2020-09-03 ENCOUNTER — Other Ambulatory Visit: Payer: Self-pay

## 2020-09-03 DIAGNOSIS — S27322D Contusion of lung, bilateral, subsequent encounter: Secondary | ICD-10-CM

## 2020-09-03 DIAGNOSIS — I959 Hypotension, unspecified: Secondary | ICD-10-CM | POA: Diagnosis not present

## 2020-09-03 DIAGNOSIS — R1310 Dysphagia, unspecified: Secondary | ICD-10-CM

## 2020-09-03 DIAGNOSIS — S0269XD Fracture of mandible of other specified site, subsequent encounter for fracture with routine healing: Secondary | ICD-10-CM | POA: Diagnosis not present

## 2020-09-03 DIAGNOSIS — S22038D Other fracture of third thoracic vertebra, subsequent encounter for fracture with routine healing: Secondary | ICD-10-CM | POA: Diagnosis not present

## 2020-09-03 DIAGNOSIS — G479 Sleep disorder, unspecified: Secondary | ICD-10-CM

## 2020-09-03 DIAGNOSIS — S069XAA Unspecified intracranial injury with loss of consciousness status unknown, initial encounter: Secondary | ICD-10-CM | POA: Diagnosis present

## 2020-09-03 DIAGNOSIS — S062X9D Diffuse traumatic brain injury with loss of consciousness of unspecified duration, subsequent encounter: Secondary | ICD-10-CM | POA: Diagnosis present

## 2020-09-03 DIAGNOSIS — S129XXD Fracture of neck, unspecified, subsequent encounter: Secondary | ICD-10-CM

## 2020-09-03 DIAGNOSIS — S02609D Fracture of mandible, unspecified, subsequent encounter for fracture with routine healing: Secondary | ICD-10-CM

## 2020-09-03 DIAGNOSIS — S22048D Other fracture of fourth thoracic vertebra, subsequent encounter for fracture with routine healing: Secondary | ICD-10-CM

## 2020-09-03 DIAGNOSIS — R1312 Dysphagia, oropharyngeal phase: Secondary | ICD-10-CM | POA: Diagnosis not present

## 2020-09-03 DIAGNOSIS — K5900 Constipation, unspecified: Secondary | ICD-10-CM | POA: Diagnosis present

## 2020-09-03 DIAGNOSIS — R000 Tachycardia, unspecified: Secondary | ICD-10-CM | POA: Diagnosis present

## 2020-09-03 DIAGNOSIS — R491 Aphonia: Secondary | ICD-10-CM

## 2020-09-03 DIAGNOSIS — Z981 Arthrodesis status: Secondary | ICD-10-CM

## 2020-09-03 DIAGNOSIS — S0240DD Maxillary fracture, left side, subsequent encounter for fracture with routine healing: Secondary | ICD-10-CM | POA: Diagnosis not present

## 2020-09-03 DIAGNOSIS — Y92410 Unspecified street and highway as the place of occurrence of the external cause: Secondary | ICD-10-CM

## 2020-09-03 DIAGNOSIS — S022XXD Fracture of nasal bones, subsequent encounter for fracture with routine healing: Secondary | ICD-10-CM | POA: Diagnosis not present

## 2020-09-03 DIAGNOSIS — S069X9D Unspecified intracranial injury with loss of consciousness of unspecified duration, subsequent encounter: Secondary | ICD-10-CM

## 2020-09-03 DIAGNOSIS — S12040D Displaced lateral mass fracture of first cervical vertebra, subsequent encounter for fracture with routine healing: Secondary | ICD-10-CM

## 2020-09-03 DIAGNOSIS — S069X9A Unspecified intracranial injury with loss of consciousness of unspecified duration, initial encounter: Principal | ICD-10-CM | POA: Diagnosis present

## 2020-09-03 DIAGNOSIS — Z931 Gastrostomy status: Secondary | ICD-10-CM | POA: Diagnosis not present

## 2020-09-03 DIAGNOSIS — S12390D Other displaced fracture of fourth cervical vertebra, subsequent encounter for fracture with routine healing: Secondary | ICD-10-CM

## 2020-09-03 DIAGNOSIS — Z681 Body mass index (BMI) 19 or less, adult: Secondary | ICD-10-CM

## 2020-09-03 DIAGNOSIS — S069X3S Unspecified intracranial injury with loss of consciousness of 1 hour to 5 hours 59 minutes, sequela: Secondary | ICD-10-CM | POA: Diagnosis not present

## 2020-09-03 DIAGNOSIS — R739 Hyperglycemia, unspecified: Secondary | ICD-10-CM | POA: Diagnosis present

## 2020-09-03 DIAGNOSIS — G8918 Other acute postprocedural pain: Secondary | ICD-10-CM

## 2020-09-03 DIAGNOSIS — F1010 Alcohol abuse, uncomplicated: Secondary | ICD-10-CM

## 2020-09-03 DIAGNOSIS — S02113D Unspecified occipital condyle fracture, subsequent encounter for fracture with routine healing: Secondary | ICD-10-CM | POA: Diagnosis not present

## 2020-09-03 DIAGNOSIS — E440 Moderate protein-calorie malnutrition: Secondary | ICD-10-CM | POA: Diagnosis present

## 2020-09-03 DIAGNOSIS — T1490XA Injury, unspecified, initial encounter: Secondary | ICD-10-CM

## 2020-09-03 DIAGNOSIS — S069X0A Unspecified intracranial injury without loss of consciousness, initial encounter: Secondary | ICD-10-CM

## 2020-09-03 DIAGNOSIS — S069X3D Unspecified intracranial injury with loss of consciousness of 1 hour to 5 hours 59 minutes, subsequent encounter: Secondary | ICD-10-CM | POA: Diagnosis not present

## 2020-09-03 LAB — CBC
HCT: 42.2 % (ref 36.0–46.0)
Hemoglobin: 13.6 g/dL (ref 12.0–15.0)
MCH: 31.1 pg (ref 26.0–34.0)
MCHC: 32.2 g/dL (ref 30.0–36.0)
MCV: 96.6 fL (ref 80.0–100.0)
Platelets: 280 10*3/uL (ref 150–400)
RBC: 4.37 MIL/uL (ref 3.87–5.11)
RDW: 11.8 % (ref 11.5–15.5)
WBC: 8.2 10*3/uL (ref 4.0–10.5)
nRBC: 0 % (ref 0.0–0.2)

## 2020-09-03 LAB — CREATININE, SERUM
Creatinine, Ser: 0.5 mg/dL (ref 0.44–1.00)
GFR, Estimated: >60 mL/min (ref >60)

## 2020-09-03 LAB — GLUCOSE, CAPILLARY
Glucose-Capillary: 117 mg/dL — ABNORMAL HIGH (ref 70–99)
Glucose-Capillary: 120 mg/dL — ABNORMAL HIGH (ref 70–99)
Glucose-Capillary: 89 mg/dL (ref 70–99)
Glucose-Capillary: 95 mg/dL (ref 70–99)

## 2020-09-03 MED ORDER — INSULIN ASPART 100 UNIT/ML ~~LOC~~ SOLN
0.0000 [IU] | Freq: Four times a day (QID) | SUBCUTANEOUS | Status: DC
  Administered 2020-09-05 – 2020-09-16 (×10): 2 [IU] via SUBCUTANEOUS
  Administered 2020-09-18: 3 [IU] via SUBCUTANEOUS
  Administered 2020-09-22: 2 [IU] via SUBCUTANEOUS

## 2020-09-03 MED ORDER — AMANTADINE HCL 50 MG/5ML PO SOLN
100.0000 mg | Freq: Two times a day (BID) | ORAL | Status: DC
  Administered 2020-09-04 – 2020-09-15 (×22): 100 mg
  Filled 2020-09-03 (×24): qty 10

## 2020-09-03 MED ORDER — FOLIC ACID 1 MG PO TABS
1.0000 mg | ORAL_TABLET | Freq: Every day | ORAL | Status: DC
  Administered 2020-09-04 – 2020-09-24 (×21): 1 mg
  Filled 2020-09-03 (×21): qty 1

## 2020-09-03 MED ORDER — ONDANSETRON HCL 4 MG/5ML PO SOLN
4.0000 mg | Freq: Four times a day (QID) | ORAL | Status: DC | PRN
  Filled 2020-09-03: qty 5

## 2020-09-03 MED ORDER — METHOCARBAMOL 750 MG PO TABS
750.0000 mg | ORAL_TABLET | Freq: Three times a day (TID) | ORAL | Status: DC
  Administered 2020-09-03 – 2020-09-18 (×45): 750 mg
  Filled 2020-09-03 (×45): qty 1

## 2020-09-03 MED ORDER — OSMOLITE 1.5 CAL PO LIQD
237.0000 mL | Freq: Every day | ORAL | Status: DC
  Administered 2020-09-03 – 2020-09-08 (×27): 237 mL

## 2020-09-03 MED ORDER — POLYETHYLENE GLYCOL 3350 17 G PO PACK
17.0000 g | PACK | Freq: Every day | ORAL | Status: DC
  Administered 2020-09-04 – 2020-09-08 (×5): 17 g
  Filled 2020-09-03 (×5): qty 1

## 2020-09-03 MED ORDER — FREE WATER
100.0000 mL | Freq: Four times a day (QID) | Status: DC
  Administered 2020-09-03 – 2020-09-24 (×82): 100 mL

## 2020-09-03 MED ORDER — MELATONIN 5 MG PO TABS
5.0000 mg | ORAL_TABLET | Freq: Every evening | ORAL | Status: DC | PRN
  Administered 2020-09-07: 5 mg
  Filled 2020-09-03 (×4): qty 1

## 2020-09-03 MED ORDER — THIAMINE HCL 100 MG PO TABS
100.0000 mg | ORAL_TABLET | Freq: Every day | ORAL | Status: DC
  Administered 2020-09-04 – 2020-09-14 (×11): 100 mg
  Filled 2020-09-03 (×11): qty 1

## 2020-09-03 MED ORDER — METOPROLOL TARTRATE 25 MG/10 ML ORAL SUSPENSION
75.0000 mg | Freq: Two times a day (BID) | ORAL | Status: DC
  Administered 2020-09-03 – 2020-09-23 (×28): 75 mg
  Filled 2020-09-03 (×43): qty 30

## 2020-09-03 MED ORDER — ENOXAPARIN SODIUM 30 MG/0.3ML ~~LOC~~ SOLN
30.0000 mg | Freq: Two times a day (BID) | SUBCUTANEOUS | Status: DC
  Administered 2020-09-03 – 2020-09-22 (×39): 30 mg via SUBCUTANEOUS
  Filled 2020-09-03 (×40): qty 0.3

## 2020-09-03 MED ORDER — BISACODYL 10 MG RE SUPP: 10.0000 mg | Freq: Every day | RECTAL | Status: DC | PRN

## 2020-09-03 MED ORDER — PANTOPRAZOLE SODIUM 40 MG PO PACK
40.0000 mg | PACK | Freq: Every day | ORAL | Status: DC
  Administered 2020-09-04 – 2020-09-24 (×21): 40 mg
  Filled 2020-09-03 (×15): qty 20

## 2020-09-03 MED ORDER — ADULT MULTIVITAMIN W/MINERALS CH
1.0000 | ORAL_TABLET | Freq: Every day | ORAL | Status: DC
Start: 2020-09-04 — End: 2020-09-24
  Administered 2020-09-04 – 2020-09-24 (×21): 1
  Filled 2020-09-03 (×21): qty 1

## 2020-09-03 MED ORDER — ENOXAPARIN SODIUM 30 MG/0.3ML ~~LOC~~ SOLN: 30.0000 mg | Freq: Two times a day (BID) | SUBCUTANEOUS | Status: DC

## 2020-09-03 MED ORDER — PROSOURCE TF PO LIQD
90.0000 mL | Freq: Two times a day (BID) | ORAL | Status: DC
  Administered 2020-09-04 – 2020-09-22 (×37): 90 mL
  Filled 2020-09-03 (×36): qty 90

## 2020-09-03 MED ORDER — GUAIFENESIN 100 MG/5ML PO SOLN
10.0000 mL | ORAL | Status: DC | PRN
  Administered 2020-09-15: 200 mg
  Filled 2020-09-03: qty 10

## 2020-09-03 MED ORDER — OXYCODONE HCL 5 MG PO TABS: 5.0000 mg | ORAL_TABLET | ORAL | Status: DC | PRN
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
## 2020-09-03 NOTE — Progress Notes (Signed)
Inpatient Rehabilitation Medication Review by a Pharmacist  A complete drug regimen review was completed for this patient to identify any potential clinically significant medication issues.  Clinically significant medication issues were identified:  No  Check AMION for pharmacist assigned to patient if future medication questions/issues arise during this admission.  Time spent performing this drug regimen review (minutes):  10  Vicki Mallet, PharmD, BCPS, [facility] Clinical Pharmacist 09/03/2020 5:45 PM
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
## 2020-09-03 NOTE — Progress Notes (Signed)
Patient admitted to [phone]. A&Ox4 no complaints of pain. Oriented to room, unit, fall and visitor policies. Call bell and belongings in reach
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
## 2020-09-03 NOTE — Progress Notes (Signed)
Inpatient Rehabilitation-Admissions Coordinator   I have a bed available for this member today in CIR. Discussed bed offer with pt and her mother. Both have agreed. Reviewed insurance benefit letter with pt's mom and consent form. All questions answered. Received medical clearance from attending service for admit to CIR today. RN and [facility] team notified of plan for admit.   Cheri Rous, OTR/L  Rehab Admissions Coordinator  [phone] 09/03/2020 10:36 AM

## 2020-09-04 ENCOUNTER — Inpatient Hospital Stay (HOSPITAL_COMMUNITY): Payer: PRIVATE HEALTH INSURANCE | Admitting: Occupational Therapy

## 2020-09-04 ENCOUNTER — Inpatient Hospital Stay (HOSPITAL_COMMUNITY): Payer: PRIVATE HEALTH INSURANCE

## 2020-09-04 ENCOUNTER — Inpatient Hospital Stay (HOSPITAL_COMMUNITY): Payer: Medicaid Other

## 2020-09-04 DIAGNOSIS — R1310 Dysphagia, unspecified: Secondary | ICD-10-CM

## 2020-09-04 DIAGNOSIS — G479 Sleep disorder, unspecified: Secondary | ICD-10-CM

## 2020-09-04 DIAGNOSIS — R739 Hyperglycemia, unspecified: Secondary | ICD-10-CM

## 2020-09-04 DIAGNOSIS — R000 Tachycardia, unspecified: Secondary | ICD-10-CM

## 2020-09-04 DIAGNOSIS — Z931 Gastrostomy status: Secondary | ICD-10-CM

## 2020-09-04 DIAGNOSIS — S069X9D Unspecified intracranial injury with loss of consciousness of unspecified duration, subsequent encounter: Secondary | ICD-10-CM

## 2020-09-04 LAB — GLUCOSE, CAPILLARY
Glucose-Capillary: 104 mg/dL — ABNORMAL HIGH (ref 70–99)
Glucose-Capillary: 118 mg/dL — ABNORMAL HIGH (ref 70–99)
Glucose-Capillary: 118 mg/dL — ABNORMAL HIGH (ref 70–99)
Glucose-Capillary: 89 mg/dL (ref 70–99)
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
## 2020-09-04 NOTE — Plan of Care (Signed)
  Problem: Sit to Stand Goal: LTG:  Patient will perform sit to stand in prep for activites of daily living with assistance level (OT) Description: LTG:  Patient will perform sit to stand in prep for activites of daily living with assistance level (OT) Flowsheets (Taken 09/04/2020 1538) LTG: PT will perform sit to stand in prep for activites of daily living with assistance level: Supervision/Verbal cueing   Problem: RH Grooming Goal: LTG Patient will perform grooming w/assist,cues/equip (OT) Description: LTG: Patient will perform grooming with assist, with/without cues using equipment (OT) Flowsheets (Taken 09/04/2020 1538) LTG: Pt will perform grooming with assistance level of: Supervision/Verbal cueing   Problem: RH Bathing Goal: LTG Patient will bathe all body parts with assist levels (OT) Description: LTG: Patient will bathe all body parts with assist levels (OT) Flowsheets (Taken 09/04/2020 1538) LTG: Pt will perform bathing with assistance level/cueing: Minimal Assistance - Patient > 75%   Problem: RH Dressing Goal: LTG Patient will perform upper body dressing (OT) Description: LTG Patient will perform upper body dressing with assist, with/without cues (OT). Flowsheets (Taken 09/04/2020 1538) LTG: Pt will perform upper body dressing with assistance level of: (excluding abdominal binder)  Supervision/Verbal cueing  Other (Comment) Goal: LTG Patient will perform lower body dressing w/assist (OT) Description: LTG: Patient will perform lower body dressing with assist, with/without cues in positioning using equipment (OT) Flowsheets (Taken 09/04/2020 1538) LTG: Pt will perform lower body dressing with assistance level of: Minimal Assistance - Patient > 75%   Problem: RH Toileting Goal: LTG Patient will perform toileting task (3/3 steps) with assistance level (OT) Description: LTG: Patient will perform toileting task (3/3 steps) with assistance level (OT)  Flowsheets (Taken 09/04/2020  1538) LTG: Pt will perform toileting task (3/3 steps) with assistance level: Minimal Assistance - Patient > 75%   Problem: RH Toilet Transfers Goal: LTG Patient will perform toilet transfers w/assist (OT) Description: LTG: Patient will perform toilet transfers with assist, with/without cues using equipment (OT) Flowsheets (Taken 09/04/2020 1538) LTG: Pt will perform toilet transfers with assistance level of: Supervision/Verbal cueing   Problem: RH Tub/Shower Transfers Goal: LTG Patient will perform tub/shower transfers w/assist (OT) Description: LTG: Patient will perform tub/shower transfers with assist, with/without cues using equipment (OT) Flowsheets (Taken 09/04/2020 1538) LTG: Pt will perform tub/shower stall transfers with assistance level of: Supervision/Verbal cueing
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
## 2020-09-05 LAB — GLUCOSE, CAPILLARY
Glucose-Capillary: 89 mg/dL (ref 70–99)
Glucose-Capillary: 98 mg/dL (ref 70–99)
Glucose-Capillary: 136 mg/dL — ABNORMAL HIGH (ref 70–99)
Glucose-Capillary: 81 mg/dL (ref 70–99)
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
## 2020-09-05 NOTE — Progress Notes (Signed)
Patient is having low blood pressure and patient's mother is concerned on giving her BP medication.  RN explained that the patients BP is taken prior to giving the blood pressure medicine and held if the BP is to low.  Patient denies dizziness or feeling light headed.  RN informed patient's mom that I will pass this information on to the night shift to pass to day shift on 09/06/20.

## 2020-09-06 ENCOUNTER — Inpatient Hospital Stay (HOSPITAL_COMMUNITY): Payer: PRIVATE HEALTH INSURANCE

## 2020-09-06 ENCOUNTER — Inpatient Hospital Stay (HOSPITAL_COMMUNITY): Payer: PRIVATE HEALTH INSURANCE | Admitting: Speech Pathology

## 2020-09-06 LAB — COMPREHENSIVE METABOLIC PANEL
ALT: 16 U/L (ref 0–44)
AST: 14 U/L — ABNORMAL LOW (ref 15–41)
Albumin: 3 g/dL — ABNORMAL LOW (ref 3.5–5.0)
Alkaline Phosphatase: 70 U/L (ref 38–126)
Anion gap: 11 (ref 5–15)
BUN: 12 mg/dL (ref 6–20)
CO2: 23 mmol/L (ref 22–32)
Calcium: 9.1 mg/dL (ref 8.9–10.3)
Chloride: 103 mmol/L (ref 98–111)
Creatinine, Ser: 0.59 mg/dL (ref 0.44–1.00)
GFR, Estimated: >60 mL/min (ref >60)
Glucose, Bld: 128 mg/dL — ABNORMAL HIGH (ref 70–99)
Potassium: 3.6 mmol/L (ref 3.5–5.1)
Sodium: 137 mmol/L (ref 135–145)
Total Bilirubin: 0.7 mg/dL (ref 0.3–1.2)
Total Protein: 6.9 g/dL (ref 6.5–8.1)

## 2020-09-06 LAB — CBC WITH DIFFERENTIAL/PLATELET
Abs Immature Granulocytes: 0.03 10*3/uL (ref 0.00–0.07)
Basophils Absolute: 0 10*3/uL (ref 0.0–0.1)
Basophils Relative: 0 %
Eosinophils Absolute: 0.1 10*3/uL (ref 0.0–0.5)
Eosinophils Relative: 2 %
HCT: 40.8 % (ref 36.0–46.0)
Hemoglobin: 14 g/dL (ref 12.0–15.0)
Immature Granulocytes: 0 %
Lymphocytes Relative: 25 %
Lymphs Abs: 1.8 10*3/uL (ref 0.7–4.0)
MCH: 32 pg (ref 26.0–34.0)
MCHC: 34.3 g/dL (ref 30.0–36.0)
MCV: 93.2 fL (ref 80.0–100.0)
Monocytes Absolute: 0.5 10*3/uL (ref 0.1–1.0)
Monocytes Relative: 7 %
Neutro Abs: 4.8 10*3/uL (ref 1.7–7.7)
Neutrophils Relative %: 66 %
Platelets: 246 10*3/uL (ref 150–400)
RBC: 4.38 MIL/uL (ref 3.87–5.11)
RDW: 11.9 % (ref 11.5–15.5)
WBC: 7.3 10*3/uL (ref 4.0–10.5)
nRBC: 0 % (ref 0.0–0.2)

## 2020-09-06 LAB — GLUCOSE, CAPILLARY
Glucose-Capillary: 101 mg/dL — ABNORMAL HIGH (ref 70–99)
Glucose-Capillary: 128 mg/dL — ABNORMAL HIGH (ref 70–99)
Glucose-Capillary: 80 mg/dL (ref 70–99)
Glucose-Capillary: 81 mg/dL (ref 70–99)
Glucose-Capillary: 97 mg/dL (ref 70–99)

## 2020-09-06 MED ORDER — METHYLPHENIDATE HCL 5 MG PO TABS
5.0000 mg | ORAL_TABLET | Freq: Two times a day (BID) | ORAL | Status: DC
  Administered 2020-09-06 – 2020-09-09 (×7): 5 mg
  Filled 2020-09-06 (×7): qty 1

## 2020-09-06 NOTE — Progress Notes (Signed)
Physical Therapy Session Note  Patient Details  Name: Tami Brown MRN: [id number] Date of Birth: 1996/01/23  Today's Date: 09/06/2020 PT Individual Time: 1104-1200 PT Individual Time Calculation (min): 56 min   Short Term Goals: Week 1:  PT Short Term Goal 1 (Week 1): Pt will complete bed mobility with minA PT Short Term Goal 2 (Week 1): Pt will complete bed<>chair transfers with minA and LRAD PT Short Term Goal 3 (Week 1): Pt will ambulate at least 133ft with minA and LRAD PT Short Term Goal 4 (Week 1): Pt will initiate stair training  Skilled Therapeutic Interventions/Progress Updates:     Pt received supine in bed and agrees to therapy. Mentions pain in L elbow and L ankle. Number not provided. PT provides mobility and rest breaks to manage pain. Supine to sit with minA. MinA for sit to stand and modA HHA for ambulatory transfer to toilet, x15'. Pt requires assistance removing brief and performs pericare with setup assistance. modA for transfer to [facility]. Pt demos posterior lean/extensor tone in standing and several slight LOBs to the L. WC transport to gym for time management. Pt performs NMR for standing balance, activity tolerance, and L lower extremity WB, with use of standing frame. PT provides multimodal cues for posture, neutral pelvis positioning, and increased WB through L leg. Pt performs 1x10 heel raises in standing frame. Mirror positioned for visual feedback. Pt then performs single leg stance on L leg with PT support R leg for balance.  Stand step transfer to mat table with modA. Pt performs sit to stand with minA and engages in activity to achieve neutral posturing with equal WB through both legs. Pt holding onto exercise ball with both hands to facilitate core engagement. Able to perform static standing with primarily minA with tactile cueing at hips. Pt then performs seated NMR with exercise ball, rolling ball out to L side to encourage side body extension and tactile feedback  through L hemibody, following by slow forward flexion at trunk and hips with ball in lap for support. 1x10.  Pt performs sit<>supine with mindA due to fatigue and wanting to rest for several minutes. Squat pivot to WC with minA. Pt then performs stand step transfer back to bed with minA. Left supine in bed with alarm intact and all needs within reach.  Therapy Documentation Precautions:  Precautions Precautions: Fall Precaution Comments: PEG + abdominal binder to protect PEG, healed trach site, Lt elbow splint for nighttime use Restrictions Weight Bearing Restrictions: No    Therapy/Group: Individual Therapy  Beau Fanny, PT, DPT  09/06/2020, 12:48 PM

## 2020-09-06 NOTE — Progress Notes (Signed)
Occupational Therapy Session Note  Patient Details  Name: Tami Brown MRN: [id number] Date of Birth: 02-22-1996  Today's Date: 09/06/2020 OT Individual Time: 1400-1500 OT Individual Time Calculation (min): 60 min    Short Term Goals: Week 1:  OT Short Term Goal 1 (Week 1): Pt will complete 3/3 components of toileting tasks with no more than Min balance assist OT Short Term Goal 2 (Week 1): Pt will complete toilet transfer with Min A and LRAD OT Short Term Goal 3 (Week 1): Pt will complete an ADL session sit<stand at the sink to increase OOB tolerance  Skilled Therapeutic Interventions/Progress Updates:    Pt received supine with mother and sister present. Nodding yes/no throughout session to questions. No c/o pain. Pt completed bed mobility with min A. Pt completed sit > stand with mod A. L foot with inversion requiring cueing for attention to bear more weight through it to maintain neutral alignment. Pt also required frequent cueing for L UE to maintain gross grasp on the RW. Pt completed several steps forward and then backward for transfer to the w/c with max A overall, several large LOB's. Pt was taken to the sink where she completed oral care with mod A, per her request. Pt with behavioral vs learned helplessness throughout session, with low frustration tolerance/ability to problem solve. Pt completed UB bathing with min A, shirt donned with mod A. Pt was able to stand and complete peri hygiene with min A, mod A for standing balance. Pants donned mod A. Pt was taken to the therapy gym where she completed several activities focused on LUE FMC and L visual scanning/attention. Mod cueing overall for attention, as well as intermittent facilitation at the shoulder/elbow. Pt was gently ranged through full elbow extension and ER at the shoulder. Pt was returned to her room and left sitting in the w/c with chair alarm belt set. Family present.   Supine: 121/71 105/68 in w/c.   Therapy  Documentation Precautions:  Precautions Precautions: Fall Precaution Comments: PEG + abdominal binder to protect PEG, healed trach site, Lt elbow splint for nighttime use Restrictions Weight Bearing Restrictions: No  Therapy/Group: Individual Therapy  Crissie Reese 09/06/2020, 6:41 AM

## 2020-09-06 NOTE — Progress Notes (Signed)
Speech Language Pathology Daily Session Note  Patient Details  Name: Tami Brown MRN: [id number] Date of Birth: [birth year]  Today's Date: 09/06/2020 SLP Individual Time: 3419-6222 SLP Individual Time Calculation (min): 55 min  Short Term Goals: Week 1: SLP Short Term Goal 1 (Week 1): Pt will participate in therpeutic PO trials with SLP only to assess oropharyngeal function SLP Short Term Goal 2 (Week 1): Pt will complete problem solving tasks related to current environment/medical dx with min A SLP Short Term Goal 3 (Week 1): Patient will perform diaphragmatic breathing and vocal techniques/exercise with min A. SLP Short Term Goal 4 (Week 1): Patient will achieve sustained phonation of vowel sounds for 1-2 seconds and mod A.  Skilled Therapeutic Interventions: Skilled treatment session focused on communication goals. SLP facilitated session by providing Max A verbal cues for initiation of verbal expression instead of gestures. Patient's speech is characterized by aphonia and with 1-3 word length utterances. SLP administered RMT testing to assess inspiratory and expiratory pressure.  Patient's MIP was 16 and patient's peak MEP was 31 both of which show meaningful weakness. Patient performed 25 repetitions of EMST at 30 cm H2O and 25 repetitions of IMST at 13 cm H2O with Mod verbal cues needed for accuracy. Patient was incontinent of urine but was able to assist in peri care and changing brief with Min verbal cues. Patient left upright in bed with alarm on and all needs within reach. Continue with current plan of care.      Pain No/Denies Pain  Therapy/Group: Individual Therapy  Georgiann Neider 09/06/2020, 3:23 PM

## 2020-09-06 NOTE — IPOC Note (Signed)
Overall Plan of Care [facility]) Patient Details Name: Tami Brown MRN: [id number] DOB: 08-25-1996  Admitting Diagnosis: TBI (traumatic brain injury) [facility])  Hospital Problems: Principal Problem:   TBI (traumatic brain injury) (HCC) Active Problems:   Sleep disturbance   Hyperglycemia     Functional Problem List: Nursing Endurance,Medication Management,Safety,Behavior  PT Balance,Behavior,Endurance,Motor,Nutrition,Pain,Perception,Safety,Sensory,Skin Integrity  OT Balance,Safety,Skin Integrity,Endurance,Motor,Nutrition  SLP Cognition,Motor,Nutrition,Safety  TR         Basic ADL's: OT Grooming,Bathing,Dressing,Toileting     Advanced  ADL's: OT Simple Meal Preparation     Transfers: PT Bed Mobility,Bed to Chair,Car  OT Toilet,Tub/Shower     Locomotion: PT Ambulation,Stairs     Additional Impairments: OT Fuctional Use of Upper Extremity  SLP Swallowing,Communication expression    TR      Anticipated Outcomes Item Anticipated Outcome  Self Feeding No goal  Swallowing  min A for PO intake   Basic self-care  Supervision-Min A  Toileting  Min A   Bathroom Transfers Supervision  Bowel/Bladder  n/a  Transfers  supervision with LRAD  Locomotion  minA with LRAD  Communication  min A  Cognition  Supervision A  Pain  Pain at or below 4  Safety/Judgment  manage safety with supervision/reminders   Therapy Plan: PT Intensity: Minimum of 1-2 x/day ,45 to 90 minutes PT Frequency: 5 out of 7 days PT Duration Estimated Length of Stay: 2-3 weeks OT Intensity: Minimum of 1-2 x/day, 45 to 90 minutes OT Frequency: 5 out of 7 days OT Duration/Estimated Length of Stay: 14-16 days SLP Intensity: Minumum of 1-2 x/day, 30 to 90 minutes SLP Frequency: 3 to 5 out of 7 days SLP Duration/Estimated Length of Stay: 2-3 weeks   Due to the current state of emergency, patients may not be receiving their 3-hours of Medicare-mandated therapy.   Team Interventions: Nursing  Interventions Patient/Family Education,Disease Management/Prevention,Medication Management,Dysphagia/Aspiration Precaution Training,Discharge Planning,Psychosocial Support  PT interventions Ambulation/gait training,Balance/vestibular training,Community reintegration,Cognitive remediation/compensation,Discharge planning,Disease management/prevention,Functional Diplomatic Services operational officer re-education,Functional mobility training,Pain management,Patient/family education,Skin care/wound management,Psychosocial support,Splinting/orthotics,Stair training,Therapeutic Activities,UE/LE Strength taining/ROM,UE/LE IT consultant propulsion/positioning,Visual/perceptual remediation/compensation  OT Interventions Balance/vestibular training,Discharge planning,Functional electrical stimulation,Pain management,Self Care/advanced ADL retraining,Therapeutic Activities,UE/LE Coordination activities,Therapeutic Exercise,Patient/family education,Functional mobility training,Disease mangement/prevention,Community reintegration,DME/adaptive equipment instruction,Neuromuscular re-education,Psychosocial support,Splinting/orthotics,UE/LE Strength taining/ROM,Wheelchair propulsion/positioning,Visual/perceptual remediation/compensation  SLP Interventions Cognitive remediation/compensation,Cueing hierarchy,Dysphagia/aspiration precaution training,Patient/family education,Oral motor exercises,Therapeutic Activities,Therapeutic Exercise,Speech/Language facilitation,Multimodal communication approach,Functional tasks  TR Interventions    SW/CM Interventions Discharge Planning,Psychosocial Support,Patient/Family Education   Barriers to Discharge MD  Medical stability  Nursing Nutrition means    PT Inaccessible home environment,Decreased caregiver support,Insurance for SNF coverage    OT Home environment access/layout,Nutrition means,Other  (comments) Family trying to find a suitable apartment for pt to d/c to  SLP Nutrition means current PEG, goal of PO diet at d/c  SW       Team Discharge Planning: Destination: PT-Home ,OT- Home , SLP-Home Projected Follow-up: PT-24 hour supervision/assistance,Outpatient PT, OT-  24 hour supervision/assistance, SLP-Home Health SLP Projected Equipment Needs: PT-To be determined, OT- To be determined, SLP-  Equipment Details: PT- , OT-  Patient/family involved in discharge planning: PT- Patient,  OT-Patient, SLP-Patient  MD ELOS: 2-3 weeks Medical Rehab Prognosis:  Excellent Assessment: The patient has been admitted for CIR therapies with the diagnosis of TBI. The team will be addressing functional mobility, strength, stamina, balance, safety, adaptive techniques and equipment, self-care, bowel and bladder mgt, patient and caregiver education, NMR, behavior, cognition, community reentry, swallowing. Goals have been set at min assist to supervision for mobility, self-care, cognition and swallowing.  Due to the current state of emergency, patients may not be receiving their 3 hours per day of Medicare-mandated therapy.    Meredith Staggers, MD, FAAPMR      See Team Conference Notes for weekly updates to the plan of care

## 2020-09-06 NOTE — Progress Notes (Addendum)
Inpatient Rehabilitation Center Individual Statement of Services  Patient Name:  Tami Brown  Date:  09/06/2020  Welcome to the Inpatient Rehabilitation Center.  Our goal is to provide you with an individualized program based on your diagnosis and situation, designed to meet your specific needs.  With this comprehensive rehabilitation program, you will be expected to participate in at least 3 hours of rehabilitation therapies Monday-Friday, with modified therapy programming on the weekends.  Your rehabilitation program will include the following services:  Physical Therapy (PT), Occupational Therapy (OT), Speech Therapy (ST), 24 hour per day rehabilitation nursing, Therapeutic Recreaction (TR), Neuropsychology, Care Coordinator, Rehabilitation Medicine, Nutrition Services, Pharmacy Services and Other  Weekly team conferences will be held on Tuesday to discuss your progress.  Your Inpatient Rehabilitation Care Coordinator will talk with you frequently to get your input and to update you on team discussions.  Team conferences with you and your family in attendance may also be held.  Expected length of stay: 14-17 Days  Overall anticipated outcome: Supervision to Min A  Depending on your progress and recovery, your program may change. Your Inpatient Rehabilitation Care Coordinator will coordinate services and will keep you informed of any changes. Your Inpatient Rehabilitation Care Coordinator's name and contact numbers are listed  below.  The following services may also be recommended but are not provided by the Inpatient Rehabilitation Center:    Home Health Rehabiltiation Services  Outpatient Rehabilitation Services    Arrangements will be made to provide these services after discharge if needed.  Arrangements include referral to agencies that provide these services.  Your insurance has been verified to be:  Psychiatrist primary doctor is:  NO PCP  Pertinent information will  be shared with your doctor and your insurance company.  Inpatient Rehabilitation Care Coordinator:  Lavera Guise, [place] [phone] or [phone]  Information discussed with and copy given to patient by: Andria Rhein, 09/06/2020, 11:42 AM

## 2020-09-06 NOTE — Progress Notes (Signed)
Inpatient Rehabilitation  Patient information reviewed and entered into eRehab system by Rochelle Nephew M. Akeisha Lagerquist, M.A., CCC/SLP, PPS Coordinator.  Information including medical coding, functional ability and quality indicators will be reviewed and updated through discharge.    
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
## 2020-09-07 ENCOUNTER — Inpatient Hospital Stay (HOSPITAL_COMMUNITY): Payer: PRIVATE HEALTH INSURANCE | Admitting: Occupational Therapy

## 2020-09-07 ENCOUNTER — Inpatient Hospital Stay (HOSPITAL_COMMUNITY): Payer: PRIVATE HEALTH INSURANCE

## 2020-09-07 ENCOUNTER — Inpatient Hospital Stay (HOSPITAL_COMMUNITY): Payer: PRIVATE HEALTH INSURANCE | Admitting: Speech Pathology

## 2020-09-07 LAB — GLUCOSE, CAPILLARY
Glucose-Capillary: 83 mg/dL (ref 70–99)
Glucose-Capillary: 90 mg/dL (ref 70–99)
Glucose-Capillary: 97 mg/dL (ref 70–99)
Glucose-Capillary: 101 mg/dL — ABNORMAL HIGH (ref 70–99)
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
## 2020-09-07 NOTE — Progress Notes (Signed)
Physical Therapy Session Note  Patient Details  Name: Tami Brown MRN: [id number] Date of Birth: 1995-09-23  Today's Date: 09/07/2020 PT Individual Time: 3329-5188 PT Individual Time Calculation (min): 71 min   Short Term Goals: Week 1:  PT Short Term Goal 1 (Week 1): Pt will complete bed mobility with minA PT Short Term Goal 2 (Week 1): Pt will complete bed<>chair transfers with minA and LRAD PT Short Term Goal 3 (Week 1): Pt will ambulate at least 115ft with minA and LRAD PT Short Term Goal 4 (Week 1): Pt will initiate stair training  Skilled Therapeutic Interventions/Progress Updates:     Pt received supine in bed and agrees to therapy. During session pt reports soreness in both shoulders. PT provides rest breaks to manage pain. Supine to sit with verbal cues on logrolling technique and body mechanics. Pt performs sit to stand transfer form EOB with minA and begins ambulating to [facility] but demos reotropulsion and LOB to the L several times, requiring heavy modA to prevent fall. WC transport to gym for time management. Pt ambulates x110' with EVA walker and minA, Gait pattern deteriorates with fatigue and pt frequently dragging L leg as distance increases. Pt ambulates x2 additional bouts of 110' with HHA modA, with cues to increase heel strike at initial contact, increase eccentric control of knee flexion during early stance phase, and tactile cueing at hips to facilitate L lateral weight shift.   Pt performs NMR for standing balance and L leg strength, performing toe taps on numbered targets placed in front and to the side of pt on 3 inch block. Pt performs with minA/modA due to frequent posterior lean and LOB, but demos improving weight shift to the L. Pt then performs sidestepping in parallel bars with mirror for visual feedback, with PT providing consistent verbal and tactile cues for neutral hip rotation, increased L hip abductor activation, upright gaze, and increased hip extension, 2x30'.  Pt performs standing balance activity standing in middle of bars and leaning front of pelvis on bar, then shifting weight posteriorly to come into neutral midline stance, 3x5 with CGA. Pt performs similar actibity but with buttocks on bar and shifting weight anteriorly. Performed for tactile, muscular, and vesitublar feedback on midline positioning.  WC transport back to room. Stand pivot back to bed with minA. Left supine in bed with alarm intact and all needs within reach.  Therapy Documentation Precautions:  Precautions Precautions: Fall Precaution Comments: PEG + abdominal binder to protect PEG, healed trach site, Lt elbow splint for nighttime use Restrictions Weight Bearing Restrictions: No    Therapy/Group: Individual Therapy  Beau Fanny, PT, DPT 09/07/2020, 4:25 PM

## 2020-09-07 NOTE — Progress Notes (Signed)
Speech Language Pathology Daily Session Note  Patient Details  Name: Tami Brown MRN: [id number] Date of Birth: 1996-04-21  Today's Date: 09/07/2020 SLP Individual Time: 1100-1200 SLP Individual Time Calculation (min): 60 min  Short Term Goals: Week 1: SLP Short Term Goal 1 (Week 1): Pt will participate in therpeutic PO trials with SLP only to assess oropharyngeal function SLP Short Term Goal 2 (Week 1): Pt will complete problem solving tasks related to current environment/medical dx with min A SLP Short Term Goal 3 (Week 1): Patient will perform diaphragmatic breathing and vocal techniques/exercise with min A. SLP Short Term Goal 4 (Week 1): Patient will achieve sustained phonation of vowel sounds for 1-2 seconds and mod A.  Skilled Therapeutic Interventions: Skilled treatment session focused on dysphagia, speech and cognitive goals. SLP facilitated session by providing Mod A verbal cues for accuracy with 25 repetitions of EMST/IMST exercises with a self-perceived effort level of 9/10. SLP also facilitated session by providing Mod A verbal cues for initiation of verbal expression vs use of gestures. Patient's speech intelligibility was moderately impaired due to aphonia and imprecise consonants that appeared to be related due to decreased lingual ROM and overall coordination.  SLP utilized functional and familiar exercises to focus on lingual coordination like looking a spoon. Patient initially would move the spoon or her head instead of her tongue in attempts to complete task but could finally lick the spoon from top to bottom by end of session. SLP also administered the [facility] Mental Status Examination (SLUMS) and patient scored  19/30 points with a score of 27 or above considered normal. Patient demonstrated deficits in attention and problem solving. Patient's boyfriend present at end of session and educated on RMT exercises to help facilitate 1 more repetition during the day.  Patient left upright in bed with alarm on and all needs within reach. Continue with current plan of care.       Pain Pain Assessment Pain Scale: 0-10 Pain Score: 0-No pain  Therapy/Group: Individual Therapy  Jakie Debow 09/07/2020, 12:26 PM

## 2020-09-07 NOTE — Plan of Care (Addendum)
Behavioral Plan   [place] Level: VII  Behavior to decrease/ eliminate:   Use of gestures instead of verbal communication Use/attention to phone during sessions  Changes to environment:   Leave lights on in room during day Have patient put phone in a designated place prior to session starting  Interventions:  Bed alarm Sleep/wake chart  Recommendations for interactions with patient:  Use open ended questions to encourage verbal expression and if patient uses gestures have her tell you what she needs  Encourage sitting up out of bed in wheelchair or recliner with alarm on.    Attendees:   Feliberto Gottron, SLP Barbie Banner, PT Kearney Hard, OT

## 2020-09-07 NOTE — Progress Notes (Signed)
Occupational Therapy Session Note  Patient Details  Name: Tami Brown MRN: [id number] Date of Birth: 14-Jul-1996  Today's Date: 09/07/2020 OT Individual Time: 9417-4081 OT Individual Time Calculation (min): 58 min    Short Term Goals: Week 1:  OT Short Term Goal 1 (Week 1): Pt will complete 3/3 components of toileting tasks with no more than Min balance assist OT Short Term Goal 2 (Week 1): Pt will complete toilet transfer with Min A and LRAD OT Short Term Goal 3 (Week 1): Pt will complete an ADL session sit<stand at the sink to increase OOB tolerance  Skilled Therapeutic Interventions/Progress Updates:    Pt greeted semi-reclined in bed and agreeable to OT treatment session focused on self-care retraining. Worked on pt verbalizing responses instead of gesturing throughout session. Stand-pivots with mod A bed>wc>toilet>shower chair. Pt needed OT assist for clothing management prior to sitting on commode. Pt voided bladder successfully. Bathing completed from shower chair with focus on functional use of L UE to wash. Pt was able to maintain grasp on wash cloth and push and pull across stomach and wash under R arm. Dressing completed from wc at the sink with education provided for hemi techniques and min/mod A. Pt brought to the gym and worked on L UE elbow wrist and shoulder flexion/extension. Cup stacking task to facilitate more wrist extension to stack cups. Medium sized peg board task with focus on functional pinch and wrist extension. Pt returned to room and pivoted back to bed with min A.   Therapy Documentation Precautions:  Precautions Precautions: Fall Precaution Comments: PEG + abdominal binder to protect PEG, healed trach site, Lt elbow splint for nighttime use Restrictions Weight Bearing Restrictions: No Pain: Denies pain  Therapy/Group: Individual Therapy  Mal Amabile 09/07/2020, 9:45 AM
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
## 2020-09-08 ENCOUNTER — Inpatient Hospital Stay (HOSPITAL_COMMUNITY): Payer: PRIVATE HEALTH INSURANCE

## 2020-09-08 ENCOUNTER — Inpatient Hospital Stay (HOSPITAL_COMMUNITY): Payer: PRIVATE HEALTH INSURANCE | Admitting: Speech Pathology

## 2020-09-08 DIAGNOSIS — S069X3D Unspecified intracranial injury with loss of consciousness of 1 hour to 5 hours 59 minutes, subsequent encounter: Secondary | ICD-10-CM

## 2020-09-08 LAB — GLUCOSE, CAPILLARY
Glucose-Capillary: 108 mg/dL — ABNORMAL HIGH (ref 70–99)
Glucose-Capillary: 147 mg/dL — ABNORMAL HIGH (ref 70–99)
Glucose-Capillary: 82 mg/dL (ref 70–99)
Glucose-Capillary: 83 mg/dL (ref 70–99)
Glucose-Capillary: 99 mg/dL (ref 70–99)

## 2020-09-08 MED ORDER — NUTRISOURCE FIBER PO PACK
1.0000 | PACK | Freq: Two times a day (BID) | ORAL | Status: DC
  Administered 2020-09-08 – 2020-09-24 (×30): 1
  Filled 2020-09-08 (×32): qty 1

## 2020-09-08 MED ORDER — OSMOLITE 1.5 CAL PO LIQD
355.0000 mL | Freq: Every day | ORAL | Status: DC
  Administered 2020-09-08 – 2020-09-17 (×42): 355 mL
  Filled 2020-09-08 (×18): qty 474

## 2020-09-08 MED ORDER — TRAZODONE HCL 50 MG PO TABS
50.0000 mg | ORAL_TABLET | Freq: Once | ORAL | Status: AC
  Administered 2020-09-08: 50 mg

## 2020-09-08 MED ORDER — TRAZODONE HCL 50 MG PO TABS
50.0000 mg | ORAL_TABLET | Freq: Once | ORAL | Status: DC
  Filled 2020-09-08: qty 1

## 2020-09-08 NOTE — Progress Notes (Signed)
Physical Therapy Session Note  Patient Details  Name: Tami Brown MRN: [id number] Date of Birth: [birth year]  Today's Date: 09/08/2020 PT Individual Time: 1005-1059 PT Individual Time Calculation (min): 54 min   Short Term Goals: Week 1:  PT Short Term Goal 1 (Week 1): Pt will complete bed mobility with minA PT Short Term Goal 2 (Week 1): Pt will complete bed<>chair transfers with minA and LRAD PT Short Term Goal 3 (Week 1): Pt will ambulate at least 159ft with minA and LRAD PT Short Term Goal 4 (Week 1): Pt will initiate stair training  Skilled Therapeutic Interventions/Progress Updates:     Pt received supine in bed and agrees to therapy. No complaint of pain. Supine to sit with increased time and verbal cues on sequencing. Sit to stand with minA and stand step transfer to WC. WC transport to gym for time management. Stand pivot to mat table with minA. Pt performs NMR for standing balance, L lateral weight shifting, and use of L hand. Pt stands and rotates toward L to retrieve bean bags, then transfers them to R hand to toss in cornhole activity. PT provides modA/mindA for stability at hips/pelvis and to facilitate L rotation and increase translation of weight into L hemibody. Pt ambulates to retrieve bags with PT providing modA at hips while bending forward to pick up bags. Pt takes brief supine rest break due to fatigue. Return to sitting with minA. Sit to stand with minA. Pt ambulates x120' with minA/modA and no AD, with multimodal cues for hip rotation, control and stability of L leg during all phases of gait, maintaining attention to task due to pt tendency to become distracted, and sequencing during turns and transition back to bed. Pt left supine in bed with alarm intact and all needs within reach.  Therapy Documentation Precautions:  Precautions Precautions: Fall Precaution Comments: PEG + abdominal binder to protect PEG, healed trach site, Lt elbow splint for nighttime  use Restrictions Weight Bearing Restrictions: No    Therapy/Group: Individual Therapy  Beau Fanny, PT, DPT 09/08/2020, 12:53 PM
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
## 2020-09-08 NOTE — Progress Notes (Signed)
Speech Language Pathology Daily Session Note  Patient Details  Name: Tami Brown MRN: [id number] Date of Birth: September 13, 1995  Today's Date: 09/08/2020 SLP Individual Time: 5449-2010 SLP Individual Time Calculation (min): 55 min  Short Term Goals: Week 1: SLP Short Term Goal 1 (Week 1): Pt will participate in therpeutic PO trials with SLP only to assess oropharyngeal function SLP Short Term Goal 2 (Week 1): Pt will complete problem solving tasks related to current environment/medical dx with min A SLP Short Term Goal 3 (Week 1): Patient will perform diaphragmatic breathing and vocal techniques/exercise with min A. SLP Short Term Goal 4 (Week 1): Patient will achieve sustained phonation of vowel sounds for 1-2 seconds and mod A.  Skilled Therapeutic Interventions: Skilled treatment session focused on dysphagia, speech and cognitive goals. SLP facilitated session by providing Mod A verbal cues for accuracy with 25 repetitions of EMST/IMST exercises with a self-perceived effort level of 8/10. SLP also facilitated session by providing Mod A verbal cues for initiation of verbal expression vs use of gestures. Patient's speech intelligibility was moderately impaired due to aphonia and imprecise consonants that appeared to be related due to decreased lingual ROM and overall coordination as well as an increased speech rate.  SLP utilized a structured verbal description task to focus on utilization of speech intelligibility strategies. Patient required overall Mod-Max A verbal cues for ~75% intelligibility at the word and phrase level. SLP also provided set-up assist for oral care via the suction toothbrush that patient performed with overall supervision verbal cues.. Patient consumed trials of ice chips without overt s/s of aspiration (silent aspiration on recent MBS) but demonstrated decreased oral manipulation and suspected prolonged AP transit with tongue pumping. Recommend ongoing trials with SLP. Patient  left upright in bed with alarm on and all needs within reach. Continue with current plan of care.       Pain Pain Assessment Pain Scale: 0-10 Pain Score: 0-No pain  Therapy/Group: Individual Therapy  Braylon Lemmons 09/08/2020, 9:51 AM

## 2020-09-08 NOTE — Progress Notes (Signed)
Occupational Therapy Session Note  Patient Details  Name: Tami Brown MRN: [id number] Date of Birth: 12-Apr-1996  Today's Date: 09/08/2020 OT Individual Time: 1345-1454 OT Individual Time Calculation (min): 69 min    Short Term Goals: Week 1:  OT Short Term Goal 1 (Week 1): Pt will complete 3/3 components of toileting tasks with no more than Min balance assist OT Short Term Goal 2 (Week 1): Pt will complete toilet transfer with Min A and LRAD OT Short Term Goal 3 (Week 1): Pt will complete an ADL session sit<stand at the sink to increase OOB tolerance  Skilled Therapeutic Interventions/Progress Updates:    Pt received supine with no c/o pain. Pt requiring cueing/encouragemnet for verbal expression. Pt completed bed mobility with min A to EOB. Pt completed stand pivot transfer to the w/c with mod A. Pt assisted in picking out her clothes with cueing for verbal expression. Pt completed stand pivot transfer from w/c > shower chair with cueing for use of grab bar. Cueing for initiation throughout session. Pt completed UB bathing with min cueing for thoroughness and inclusion of the LUE. Standing level peri cleansing with min A. Figure 4 technique for washing distal LE with min A. Pt returned to w/c and donned deodorant with min A. Shirt donned CGA. Pants donned with min A for pulling up in standing. Pt completed oral care with mod A for thoroughness. Pt was taken to the therapy gym to work with the [place] system. Pt completed standing level functional reaching activity to work on LUE NMR and dynamic standing balance. Mod A for standing balance, especially with R reaching. Min facilitation for L shoulder flexion. Pt required extended rest break after 5 min standing trial. Next she completed standing level sequencing task, with verbal expression component as she was instructed to say each number as she pressed it ,cueing for volume and articulation. Pt with min facilitation for L reaching again. Pt returned  to her w/c and was taken back to the room. She completed 5 ft of functional mobility back to her bed with mod HHA. Pt was left supine with all needs met, bed alarm set, and all 4 rails up per her request. Auntie present in room.   Therapy Documentation Precautions:  Precautions Precautions: Fall Precaution Comments: PEG + abdominal binder to protect PEG, healed trach site, Lt elbow splint for nighttime use Restrictions Weight Bearing Restrictions: No   Therapy/Group: Individual Therapy  Curtis Sites 09/08/2020, 6:45 AM

## 2020-09-08 NOTE — Progress Notes (Signed)
Patient complained melatonin is ineffective and she does not get any sleep at night. [place], PA made aware. Melatonin discontinued. Trazodone will be given tonight. PA will evaluate further in the AM.
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
## 2020-09-08 NOTE — Progress Notes (Signed)
Patient ID: Tami Brown, female   DOB: [birth year], 25 y.o.   MRN: [id number] Team Conference Report to Patient/Family  Team Conference discussion was reviewed with the patient and caregiver, including goals, any changes in plan of care and target discharge date.  Patient and caregiver express understanding and are in agreement.  The patient has a target discharge date of 09/24/20.  Andria Rhein 09/08/2020, 11:50 AM

## 2020-09-09 ENCOUNTER — Inpatient Hospital Stay (HOSPITAL_COMMUNITY): Payer: PRIVATE HEALTH INSURANCE

## 2020-09-09 ENCOUNTER — Inpatient Hospital Stay (HOSPITAL_COMMUNITY): Payer: PRIVATE HEALTH INSURANCE | Admitting: Speech Pathology

## 2020-09-09 ENCOUNTER — Inpatient Hospital Stay (HOSPITAL_COMMUNITY): Payer: PRIVATE HEALTH INSURANCE | Admitting: Physical Therapy

## 2020-09-09 LAB — GLUCOSE, CAPILLARY
Glucose-Capillary: 134 mg/dL — ABNORMAL HIGH (ref 70–99)
Glucose-Capillary: 88 mg/dL (ref 70–99)
Glucose-Capillary: 92 mg/dL (ref 70–99)

## 2020-09-09 MED ORDER — TRAZODONE HCL 50 MG PO TABS
50.0000 mg | ORAL_TABLET | Freq: Every day | ORAL | Status: DC
  Filled 2020-09-09: qty 1

## 2020-09-09 MED ORDER — TRAZODONE HCL 50 MG PO TABS
50.0000 mg | ORAL_TABLET | Freq: Every day | ORAL | Status: DC
  Administered 2020-09-09: 50 mg

## 2020-09-09 MED ORDER — METHYLPHENIDATE HCL 5 MG PO TABS
10.0000 mg | ORAL_TABLET | Freq: Two times a day (BID) | ORAL | Status: DC
  Administered 2020-09-10 – 2020-09-24 (×29): 10 mg
  Filled 2020-09-09 (×29): qty 2

## 2020-09-09 NOTE — Progress Notes (Signed)
Patient slept for approximately 3hrs intermittently. Still complains of not feeling rested.

## 2020-09-09 NOTE — Progress Notes (Signed)
Physical Therapy Session Note  Patient Details  Name: Tami Brown MRN: [id number] Date of Birth: Jun 30, 1996  Today's Date: 09/09/2020 PT Individual Time: 9417-4081 PT Individual Time Calculation (min): 69 min   Short Term Goals: Week 1:  PT Short Term Goal 1 (Week 1): Pt will complete bed mobility with minA PT Short Term Goal 2 (Week 1): Pt will complete bed<>chair transfers with minA and LRAD PT Short Term Goal 3 (Week 1): Pt will ambulate at least 145f with minA and LRAD PT Short Term Goal 4 (Week 1): Pt will initiate stair training  Skilled Therapeutic Interventions/Progress Updates:   Pt received supine in bed and agreeable to PT. Supine>sit transfer with min assist to protect the LUE. Lower body dressing with mod assist to pull pants over  Feet and then pull to waist once in standing. Min assist from PT to come into standing with cues for attention the LUE and LLE. [facility] and moderate cues for improved use of the LLE with back stepping to seat. Min assist for all sit<>stand transfers with mild impulsivity noted.  Sustained attention/coordination training to perform BITS tracing x 2 wit hthe RUE and x 2 with the LUE then progressed to rotational visual tracking color x1 and sequencing x 2 with the RUE and x 2 with the LUE. Increased time required for use of the LUE due to tone in the Bicep with min assist to improve coordination. Pt perform roughly 75% of trials in standing with 1 UE support on RW. Min assist from PT to facilitate WB in the LLE.   Gait training with RW x 1043fand min-mod assist for AD management and safety in turns. Noted to have flat foot on the L and intermittent poor stance time on the LLE. Pt returned to room and performed ambulatory transfer to bed with min assist and RW. Sit>supine completed with supervision assist, and left supine in bed with call bell in reach and all needs met.          Therapy  Documentation Precautions:  Precautions Precautions: Fall Precaution Comments: PEG + abdominal binder to protect PEG, healed trach site, Lt elbow splint for nighttime use Restrictions Weight Bearing Restrictions: No    Pain: denies   Therapy/Group: Individual Therapy  AuLorie Brown/02/2021, 5:37 PM

## 2020-09-09 NOTE — Progress Notes (Signed)
New order trazodone 50mg  QHS started, verbal order from [place], [place].  [place], LPN
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
## 2020-09-09 NOTE — Progress Notes (Signed)
Speech Language Pathology Daily Session Note  Patient Details  Name: Tami Brown MRN: [id number] Date of Birth: 1996/06/23  Today's Date: 09/09/2020 SLP Individual Time: 4098-1191 SLP Individual Time Calculation (min): 55 min  Short Term Goals: Week 1: SLP Short Term Goal 1 (Week 1): Pt will participate in therpeutic PO trials with SLP only to assess oropharyngeal function SLP Short Term Goal 2 (Week 1): Pt will complete problem solving tasks related to current environment/medical dx with min A SLP Short Term Goal 3 (Week 1): Patient will perform diaphragmatic breathing and vocal techniques/exercise with min A. SLP Short Term Goal 4 (Week 1): Patient will achieve sustained phonation of vowel sounds for 1-2 seconds and mod A.  Skilled Therapeutic Interventions: Skilled treatment session focused on cognitive, communication and dysphagia goals. SLP facilitated session by providing overall Mod A verbal and visual cues for problem solving and error awareness during a basic money management task. Patient given RMT devices and was able to perform 25 repetitions of both IMST and EMST with overall Mod I with intermittent verbal cues needed for increase force. Patient performed oral care via the suction toothbrush after set-up with Min verbal cues needed for thoroughness. Patient also consumed trials of ice chips without overt s/s of aspiration and appeared to demonstrate improved ability to swallow bolus in 1 swallow with minimal piecemeal swallowing observed. SLP also provided Mod verbal cues for use of a slow speech rate to maximize speech intelligibility at the word and phrase level. Patient's parents present and educated on goals of care, strategies to maximize speech intelligibility and cues needed to facilitate RMT. Both verbalized understanding. Patient left upright in bed with alarm on and family present. Continue with current plan of care.      Pain Pain Assessment Pain Scale: 0-10 Pain Score:  0-No pain  Therapy/Group: Individual Therapy  Bryen Hinderman 09/09/2020, 3:34 PM

## 2020-09-09 NOTE — Progress Notes (Signed)
Occupational Therapy Session Note  Patient Details  Name: Tami Brown MRN: [id number] Date of Birth: 1995-09-12  Today's Date: 09/09/2020 OT Individual Time: 2706-2376 OT Individual Time Calculation (min): 68 min    Short Term Goals: Week 1:  OT Short Term Goal 1 (Week 1): Pt will complete 3/3 components of toileting tasks with no more than Min balance assist OT Short Term Goal 2 (Week 1): Pt will complete toilet transfer with Min A and LRAD OT Short Term Goal 3 (Week 1): Pt will complete an ADL session sit<stand at the sink to increase OOB tolerance  Skilled Therapeutic Interventions/Progress Updates:    1:1. Pt received in bed agreeable to OT. Pt with no pain and requires MAX encouragement throughout session to vocalize. Pt agreeable to shower.   ADL:  Pt completes bathing with min-MOD A for standing balance during peri/buttocks washing and min facilitation of LUE to wash RUE. Pt completes UB dressing without fallowing hemi techniques but able to don with VC for visual scanning and forced use of LUE to reach and grab shirt on L Pt completes LB dressing with A to advance tight leggings over hips and MOD A for standing balnce Pt completes footwear with with A to fasten laces and push L heel into shoe Pt completes shower/Tub transfer with MIN A using grab bar and VC for L foot placement   Therapeutic activity Pt completes standing Wii bowling activity with pt requiring MOD VC for remote use initially, however after 2 frames independently using remote to play. Pt completes sit to stand for each frame with tray table placed on L for WB through LUE. Pt requires VC for controlled decent to chair for safety. Pt with poor carryover throughout.  Pt left at end of session in bed with exit alarm on, call light in reach and all needs met   Therapy Documentation Precautions:  Precautions Precautions: Fall Precaution Comments: PEG + abdominal binder to protect PEG, healed trach site, Lt  elbow splint for nighttime use Restrictions Weight Bearing Restrictions: No General:   Vital Signs:   Pain:   ADL: ADL Eating: Not assessed Grooming: Moderate assistance Where Assessed-Grooming: Edge of bed Upper Body Bathing: Moderate assistance Where Assessed-Upper Body Bathing: Edge of bed Lower Body Bathing: Moderate assistance Where Assessed-Lower Body Bathing: Edge of bed Upper Body Dressing: Contact guard Where Assessed-Upper Body Dressing: Edge of bed Lower Body Dressing: Maximal assistance Where Assessed-Lower Body Dressing: Edge of bed Toileting: Moderate assistance Where Assessed-Toileting: Bedside Commode Toilet Transfer: Moderate assistance Toilet Transfer Method: Stand pivot Social research officer, government: Not assessed Vision   Perception    Praxis   Exercises:   Other Treatments:     Therapy/Group: Individual Therapy  Tonny Branch 09/09/2020, 9:59 AM

## 2020-09-10 ENCOUNTER — Inpatient Hospital Stay (HOSPITAL_COMMUNITY): Payer: PRIVATE HEALTH INSURANCE | Admitting: Speech Pathology

## 2020-09-10 ENCOUNTER — Inpatient Hospital Stay (HOSPITAL_COMMUNITY): Payer: PRIVATE HEALTH INSURANCE

## 2020-09-10 DIAGNOSIS — E440 Moderate protein-calorie malnutrition: Secondary | ICD-10-CM | POA: Insufficient documentation

## 2020-09-10 DIAGNOSIS — G479 Sleep disorder, unspecified: Secondary | ICD-10-CM

## 2020-09-10 DIAGNOSIS — R1312 Dysphagia, oropharyngeal phase: Secondary | ICD-10-CM

## 2020-09-10 LAB — GLUCOSE, CAPILLARY
Glucose-Capillary: 124 mg/dL — ABNORMAL HIGH (ref 70–99)
Glucose-Capillary: 132 mg/dL — ABNORMAL HIGH (ref 70–99)
Glucose-Capillary: 83 mg/dL (ref 70–99)
Glucose-Capillary: 83 mg/dL (ref 70–99)
Glucose-Capillary: 93 mg/dL (ref 70–99)

## 2020-09-10 LAB — CREATININE, SERUM
Creatinine, Ser: 0.56 mg/dL (ref 0.44–1.00)
GFR, Estimated: >60 mL/min (ref >60)

## 2020-09-10 MED ORDER — TRAZODONE HCL 50 MG PO TABS
75.0000 mg | ORAL_TABLET | Freq: Every day | ORAL | Status: DC
  Administered 2020-09-10 – 2020-09-13 (×4): 75 mg
  Filled 2020-09-10 (×4): qty 2

## 2020-09-10 NOTE — Progress Notes (Signed)
Speech Language Pathology Weekly Progress and Session Note  Patient Details  Name: Tami Brown MRN: [id number] Date of Birth: September 13, 1995  Beginning of progress report period: September 03, 2020 End of progress report period: September 10, 2020  Today's Date: 09/10/2020 SLP Individual Time: 1300-1355 SLP Individual Time Calculation (min): 55 min  Short Term Goals: Week 1: SLP Short Term Goal 1 (Week 1): Pt will participate in therpeutic PO trials with SLP only to assess oropharyngeal function SLP Short Term Goal 1 - Progress (Week 1): Met SLP Short Term Goal 2 (Week 1): Pt will complete problem solving tasks related to current environment/medical dx with min A SLP Short Term Goal 2 - Progress (Week 1): Not met SLP Short Term Goal 3 (Week 1): Patient will perform diaphragmatic breathing and vocal techniques/exercise with min A. SLP Short Term Goal 3 - Progress (Week 1): Met SLP Short Term Goal 4 (Week 1): Patient will achieve sustained phonation of vowel sounds for 1-2 seconds and mod A. SLP Short Term Goal 4 - Progress (Week 1): Not met    New Short Term Goals: Week 2: SLP Short Term Goal 1 (Week 2): Patient will utilize verbal expression to express wants/needs at the phrase level instead of the use of gesture in 75% of opportunities with Mod verbal cues. SLP Short Term Goal 2 (Week 2): Patient will utilize speech intelligibility strategies (slow rate, over articulation) at the phrase level to achieve~80% intelligibility with Mod verbal cues. SLP Short Term Goal 3 (Week 2): Patient will demonstrate functional problem solving for basic and familiar tasks with Min verbal cues. SLP Short Term Goal 4 (Week 2): Patient will demonstrate selective attention in a mildly distracting enviornment for 45 minutes with Min verbal cues for redirection. SLP Short Term Goal 5 (Week 2): Patient will recall new, daily information with Min verbal cues. SLP Short Term Goal 6 (Week 2): Patient will consume trials  of ice chips with minimal overt s/s of aspiration and Min verbal cues to assess readiness for repeat MBS.  Weekly Progress Updates: Patient has made functional gains and has met 2 of 4 STGs this reporting period. Currently, patient demonstrates behaviors consistent with a Rancho Level VII and requires overall Mod verbal cues to complete functional and familiar tasks safely in regards to problem solving, recall and attention. Max A verbal cues are needed for use of verbal expression vs gestures to communicate wants/needs and for use of speech intelligibility strategies to maximize intelligibility at the phrase level. Patient remains NPO and is participating in trials with SLP with focus on oral manipulation. Patient and family education ongoing. Patient would benefit from continued skilled SLP intervention to maximize her cognitive, communication and swallowing function prior to discharge.      Intensity: Minumum of 1-2 x/day, 30 to 90 minutes Frequency: 3 to 5 out of 7 days Duration/Length of Stay: 09/24/20 Treatment/Interventions: Cognitive remediation/compensation;Cueing hierarchy;Dysphagia/aspiration precaution training;Patient/family education;Oral motor exercises;Therapeutic Activities;Therapeutic Exercise;Speech/Language facilitation;Multimodal communication approach;Functional tasks   Daily Session  Skilled Therapeutic Interventions: Skilled treatment session focused on cognitive and communication goals. Upon arrival, patient was upright in bed but appeared lethargic. Patient reported she has not been sleeping well and was constantly yawning throughout session. Patient performed 25 repetitions of IMST and EMST exercises with overall Mod I for accuracy and effort. SLP facilitated session by providing Mod A verbal cues for use of speech intelligibility strategies at the word level to improve intelligibility. Stimulus focused on the phonemes /l/ and /sh/ in all positions as  patient demonstrates  decreased lingual ROM and strength which impacts function. Due to lingual weakness, patient demonstrates difficulty performing the [facility] along with other base of tongue strengthening exercises. Patient also organized a calendar with extra time and overall Mod A verbal cues for use of her LUE as well as proper orientation of numbers. Overall, increased cues were needed today for use of verbal expression of wants/needs vs gestures. Patient left upright in bed with alarm on and all needs within reach. Continue with current plan of care.       Pain No/Denies Pain   Therapy/Group: Individual Therapy  Tarshia Kot 09/10/2020, 6:30 AM

## 2020-09-10 NOTE — Progress Notes (Signed)
Patient ID: Tami Brown, female   DOB: 08-03-1996, 25 y.o.   MRN: [id number]  SW spoke with [facility] and [facility] [phone]) to schedule hospital follow-up. Appt scheduled for Wednesday, February 2 at 10:30am with Victorino Sparrow, NP. SW requested discussion of orange card due to out-of-state insurance.   Cecile Sheerer, MSW, LCSWA Office: [phone] Cell: [phone] Fax: [phone]
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
## 2020-09-10 NOTE — Progress Notes (Signed)
Occupational Therapy Session Note  Patient Details  Name: Tami Brown MRN: [id number] Date of Birth: 06-07-1996  Today's Date: 09/10/2020 OT Individual Time: 4782-9562 OT Individual Time Calculation (min): 57 min    Short Term Goals: Week 2:     Skilled Therapeutic Interventions/Progress Updates:     Pt received in bed with no pain and RN in room preparing meds/tube feed. Pt completes ambaultory transfer with RW and MOD A with poor attention to L hand on RW requiring A to steer into bathroom  ADL:  Pt completes bathing with MIN A for standing balance and facilitation of WB LUE into grab bar/weight in LLE in stance. Pt requires mod encouragement to incorporate LUE into self care tasks Pt completes UB dressing with S and 1 VC for thread LUE first Pt completes LB dressing with MIN A sit to stand at sink and VC for threading 1LE at a time as pt attempts to do both and loses postural control in chair Pt completes footwear with S to don B socks with poor frustration tolerance and VC for BUE use. Pt WB through LUE to pull out standard shoe laces out of shoes and OT applies elastic into shoe Pt completes toileting with A for stnaidng balance and VC for ant/pot misline orientation d/t poor proprioception during wiping task Pt completes toileting transfer with MOD A at ambulatory level with RW as stated above Pt completes shower/Tub transfer with MIN A using grab bar   Pt left at end of session in bed with exit alarm on, call light in reach and all needs met   Therapy Documentation Precautions:  Precautions Precautions: Fall Precaution Comments: PEG + abdominal binder to protect PEG, healed trach site, Lt elbow splint for nighttime use Restrictions Weight Bearing Restrictions: No General:   Vital Signs: Therapy Vitals Temp: 98.6 F (37 C) Pulse Rate: 73 Resp: 18 BP: 107/68 Patient Position (if appropriate): Lying Oxygen Therapy SpO2: 98 % O2 Device: Room Air Pain:    ADL: ADL Eating: Not assessed Grooming: Moderate assistance Where Assessed-Grooming: Edge of bed Upper Body Bathing: Moderate assistance Where Assessed-Upper Body Bathing: Edge of bed Lower Body Bathing: Moderate assistance Where Assessed-Lower Body Bathing: Edge of bed Upper Body Dressing: Contact guard Where Assessed-Upper Body Dressing: Edge of bed Lower Body Dressing: Maximal assistance Where Assessed-Lower Body Dressing: Edge of bed Toileting: Moderate assistance Where Assessed-Toileting: Bedside Commode Toilet Transfer: Moderate assistance Toilet Transfer Method: Stand pivot Social research officer, government: Not assessed Vision   Perception    Praxis   Exercises:   Other Treatments:     Therapy/Group: Individual Therapy  Tonny Branch 09/10/2020, 6:50 AM

## 2020-09-10 NOTE — Progress Notes (Signed)
Physical Therapy Weekly Progress Note  Patient Details  Name: Tami Brown MRN: [id number] Date of Birth: November 28, 1995  Beginning of progress report period: September 04, 2020 End of progress report period: September 10, 2020  Today's Date: 09/10/2020 PT Individual Time: 1000-1115 PT Individual Time Calculation (min): 75 min   Patient has met 2 of 4 short term goals.  She continues to require mod assist w/gait and at times mod assist w/transfers due to impulsive nature of movement and post tendency.  These goals remain appropriate and continued treatment to address will be provided.  She would benefit from focus on core stability training to improve balanceand safety w/all dynamic activities.  Patient continues to demonstrate the following deficits muscle weakness, decreased cardiorespiratoy endurance, impaired timing and sequencing, abnormal tone, unbalanced muscle activation, decreased coordination and decreased motor planning, decreased attention to left and decreased attention, decreased awareness, decreased problem solving, decreased safety awareness and decreased memory and therefore will continue to benefit from skilled PT intervention to increase functional independence with mobility.  Patient progressing toward long term goals..  Continue plan of care.  PT Short Term Goals Week 1:  PT Short Term Goal 1 (Week 1): Pt will complete bed mobility with minA PT Short Term Goal 1 - Progress (Week 1): Met PT Short Term Goal 2 (Week 1): Pt will complete bed<>chair transfers with minA and LRAD PT Short Term Goal 2 - Progress (Week 1): Partly met PT Short Term Goal 3 (Week 1): Pt will ambulate at least 161f with minA and LRAD PT Short Term Goal 3 - Progress (Week 1): Partly met PT Short Term Goal 4 (Week 1): Pt will initiate stair training PT Short Term Goal 4 - Progress (Week 1): Met Week 2:  PT Short Term Goal 1 (Week 2): Pt will require supervision w bed mobility PT Short Term Goal 2 (Week 2):  Pt will perform safe stand pivot transfers consistently w/min assist PT Short Term Goal 3 (Week 2): Gait 1577fw/min assist and LRAD PT Short Term Goal 4 (Week 2): pt will ascend/descend 4 5in stairs w/2 rails w/min assist and minimal cueing for safety  Skilled Therapeutic Interventions/Progress Updates:      Therapy Documentation Precautions:  Precautions Precautions: Fall Precaution Comments: PEG + abdominal binder to protect PEG, healed trach site, Lt elbow splint for nighttime use Restrictions Weight Bearing Restrictions: No   Pt initially supine and agreeable to session.  Pt tends to nod and point, encouraged to verbalize througout session. Supine to sit w/mod assist.  donns shoes in sitting, able to leand to floor to place shoes, slides feet into shoes then mod assist to completely don, maintains sitting balance w/supervision.  stand pivot transfer to wc w/max cues for safety, mod assist for balance due to post tendency.  wc propulstion using bilat LEs for attention to Llimb and LE strengthening x 5051f/cues to increase LLE use.  Pt initiated stair training today on 3in steps w/2 rails.  Able to ascend and descend 8 steps w/max cues for sequencing and gait pattern, min assist for balance except w/turning at top of staircase was mod assist due to post tendency. Repeated above x 2 w/seated rest between efforts.     Balance: [facility] w/donning harness in sitting.  Sit to stand w/min assist from mat.  Gait - pt moves very impulsively and loses balance relying max assist of lift to prevent lob. Proceeds [id number] w/tuning 90 at midpoint, impulsive,  mult balance losses. Pt does not attempt to stabilize before proceeding.  Standing lateral lunge, cues to decrease speed and focus on regaining satic balance w/each rep, post tendency dominates, improved control w/repetition/focus on goal.  Standing marching in place - as above  Standing on  foam Standing on foam and pushing on therapist w/LUE w/focus on LUE control and regaining balance w/release.  Standing bouncing ball w/bilat UEs, tossing ball, bouncing w/LUE for focus on balance during dual UE task, use of LUE.   Gait - no AD mod assist x 147f flexor posturing of LUE, increased step length RLE, sifnificant trunk and pelvic instability results in overall poor balance w/gait, mild inversion tendency of L ankle particularly when balance fails    wc propulsion x 5 feet w/encouragment to utilize LUE w/task, very slow w/many corrections needed but able to perform w/cues only.  At end of session pt transported to room.  Stand pivot transfer wc to bed w/mmin assist and max cues for control/pace.  Sit to supine w/cga and cues.  Parents present and patient encouraged to verbalize summary of session.  Uses single words to describe activities.  Mother requested therapist talk w/her outside of room and assured mother that continued physical progress was likely with pt.      Therapy/Group: Individual Therapy BCallie Fielding PBlackwell1/03/2021, 12:53 PM

## 2020-09-10 NOTE — Plan of Care (Signed)
  Problem: RH Expression Communication Goal: LTG Patient will verbally express basic/complex needs(SLP) Description: LTG:  Patient will verbally express basic/complex needs, wants or ideas with cues  (SLP) Flowsheets (Taken 09/10/2020 0629) LTG: Patient will verbally express basic/complex needs, wants or ideas (SLP): Supervision   Problem: RH Memory Goal: LTG Patient will use memory compensatory aids to (SLP) Description: LTG:  Patient will use memory compensatory aids to recall biographical/new, daily complex information with cues (SLP) Flowsheets (Taken 09/10/2020 0629) LTG: Patient will use memory compensatory aids to (SLP): Supervision   Problem: RH Attention Goal: LTG Patient will demonstrate this level of attention during functional activites (SLP) Description: LTG:  Patient will will demonstrate this level of attention during functional activites (SLP) Flowsheets (Taken 09/10/2020 6195) Patient will demonstrate during cognitive/linguistic activities the attention type of: Selective LTG: Patient will demonstrate this level of attention during cognitive/linguistic activities with assistance of (SLP): Supervision   Problem: RH Awareness Goal: LTG: Patient will demonstrate awareness during functional activites type of (SLP) Description: LTG: Patient will demonstrate awareness during functional activites type of (SLP) Flowsheets (Taken 09/10/2020 0932) Patient will demonstrate during cognitive/linguistic activities awareness type of: Emergent LTG: Patient will demonstrate awareness during cognitive/linguistic activities with assistance of (SLP): Supervision

## 2020-09-10 NOTE — Progress Notes (Signed)
Patient states still not receiving adequate sleep with trazodone. Jesusita Oka, PA made aware. New order received to increase trazodone to 75mg .

## 2020-09-11 LAB — GLUCOSE, CAPILLARY
Glucose-Capillary: 95 mg/dL (ref 70–99)
Glucose-Capillary: 120 mg/dL — ABNORMAL HIGH (ref 70–99)
Glucose-Capillary: 104 mg/dL — ABNORMAL HIGH (ref 70–99)

## 2020-09-12 ENCOUNTER — Inpatient Hospital Stay (HOSPITAL_COMMUNITY): Payer: PRIVATE HEALTH INSURANCE

## 2020-09-12 LAB — GLUCOSE, CAPILLARY
Glucose-Capillary: 114 mg/dL — ABNORMAL HIGH (ref 70–99)
Glucose-Capillary: 125 mg/dL — ABNORMAL HIGH (ref 70–99)
Glucose-Capillary: 87 mg/dL (ref 70–99)
Glucose-Capillary: 99 mg/dL (ref 70–99)
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
## 2020-09-13 ENCOUNTER — Inpatient Hospital Stay (HOSPITAL_COMMUNITY): Payer: PRIVATE HEALTH INSURANCE

## 2020-09-13 LAB — GLUCOSE, CAPILLARY
Glucose-Capillary: 114 mg/dL — ABNORMAL HIGH (ref 70–99)
Glucose-Capillary: 145 mg/dL — ABNORMAL HIGH (ref 70–99)
Glucose-Capillary: 75 mg/dL (ref 70–99)
Glucose-Capillary: 91 mg/dL (ref 70–99)

## 2020-09-13 NOTE — Progress Notes (Signed)
Physical Therapy Session Note  Patient Details  Name: Tami Brown MRN: [id number] Date of Birth: 1996-07-27  Today's Date: 09/13/2020 PT Individual Time: 1302-1400 and 800-900 PT Individual Time Calculation (min): 58 min and 60 min  Short Term Goals: Week 1:  PT Short Term Goal 1 (Week 1): Pt will complete bed mobility with minA PT Short Term Goal 1 - Progress (Week 1): Met PT Short Term Goal 2 (Week 1): Pt will complete bed<>chair transfers with minA and LRAD PT Short Term Goal 2 - Progress (Week 1): Partly met PT Short Term Goal 3 (Week 1): Pt will ambulate at least 164f with minA and LRAD PT Short Term Goal 3 - Progress (Week 1): Partly met PT Short Term Goal 4 (Week 1): Pt will initiate stair training PT Short Term Goal 4 - Progress (Week 1): Met Week 2:  PT Short Term Goal 1 (Week 2): Pt will require supervision w bed mobility PT Short Term Goal 2 (Week 2): Pt will perform safe stand pivot transfers consistently w/min assist PT Short Term Goal 3 (Week 2): Gait 1534fw/min assist and LRAD PT Short Term Goal 4 (Week 2): pt will ascend/descend 4 5in stairs w/2 rails w/min assist and minimal cueing for safety Week 3:    Week 4:     Skilled Therapeutic Interventions/Progress Updates:   AM SESSION PAIN denies pain Dons pants w/min assist and max cues to attend to use of LUE w/task, additional time. Doffs footies w/cues Supine to sit w/supervison Dons shoes w/max assist for time management stand pivot transfer to wc w/min assist   wc propulsion x 8575f/bilat LEs and min assist, cues to increase LLE use and to focus on heelstrike and full knee extension.  Sit to stand and stand to tall kneeling on mat w/min assist. Worked on static stability in tall kneeling, pelvic tilts to increase abdominal activation/achive neutral spine/tends to sink into excessive lordosis in upright. Increased sway but no balance loss progresssed to pelvic tilt w/UE alternating flexion Tall kneeling to  sitting on haunches repeated x 10 Side stepping in tall kneeling w/cues for maintaining midline, isolating hip/pelvic stability, cues to isolate movement/activate abdominals w/task Tall kneeling w/UEs in "push up position on wall in front of pt for UE wbing   Standing on wedge 1 min stretch x 3 followed by isolated DF x 12, 3 sets Standing islated hamstring curls using mirror  Standing isolated hip extension using mirror   Gait x 150f41fmin assist to occasional mod assist w/primary deficits related to corehip dynamic instability.  No inversion tendency or difficulty w/clearance thru swing today.  Sit to supine w/supervision.  Pt left supine w/rails up x 3, alarm set, bed in lowest position, and needs in reach.    PM SESSION PAIN denies pain Pt parents present initially.  Father asking about if we were doing "wieght training" to improve strength of LLE.  Discussed neuromuscular reeducation/differences in general fitness training neurmuscular reed needs of pt.  Parents then observed pt as she performed gait >150ft39fluding opening weighted door w/min to occasional mod assist for balance, primarily due to core instability.  Repeated Sit to stand + sidestep + stand to sit w/L2 theraband ressistance to address pelvic weakness length of mat x 3 each direction  Seated resisted L hip abd w/L2 theraband to address adducter weakness 2 x 15  Standing TKEs w/L2 theraband resistance w/cues for pelvic alignment during activity (tends to retract L) 3x15  Seated rowing using 2  handed grip on dowel w/therapist providing resistance 3 min x 1, 1 min x 1 for core strengthening  stanidng w/mirror for feedback alternating tapping 3in step w/emphasis on regaining midline between reps, cga to mod assist but much improved pelvic posture w/task from previous sessions.  Balance reactions are delayed throughout.   Gait x 110f w/min to occasional mod assist, decreased control of placement LLE w/inconsistent step  length, trendelenberg, occasional extension thrust at knee, decreased core stability, cues for L armswing/volitional inhibition of flexor posturning, cues for abdomninal activation.  Pt transfers back to bed w/min assist.  Pt left supine w/rails up x 4, alarm set, bed in lowest position, and needs in reach.      Therapy Documentation Precautions:  Precautions Precautions: Fall Precaution Comments: PEG + abdominal binder to protect PEG, healed trach site, Lt elbow splint for nighttime use Restrictions Weight Bearing Restrictions: No    Therapy/Group: Individual Therapy  BCallie Fielding PEast [place]on1/06/2021, 4:13 PM
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
## 2020-09-13 NOTE — Progress Notes (Signed)
Patient ID: Tami Brown, female   DOB: 11-02-1995, 25 y.o.   MRN: [id number]  SW provided pt mother [name] with hospital follow-up/appt card for upcoming medical appointment.   Cecile Sheerer, MSW, LCSWA Office: [phone] Cell: [phone] Fax: [phone]

## 2020-09-13 NOTE — Progress Notes (Signed)
Occupational Therapy Weekly Progress Note  Patient Details  Name: Tami Brown MRN: [id number] Date of Birth: 1995-12-30  Beginning of progress report period: September 04, 2020 End of progress report period: September 13, 2020  Today's Date: 09/13/2020 OT Individual Time: 1700-1749 OT Individual Time Calculation (min): 70 min    Patient has met 2 of 3 short term goals.  Tami Brown has made great progress this reporting period. She is able to complete UB dressing with supervision to min A, as well as LB dressing with mod A overall. She is often limited be a poor frustration tolerance that can limit her ability to continue problem solving through tasks. She requires cueing for verbal expression frequently, preferring to point/gesture.   Patient continues to demonstrate the following deficits: muscle weakness and muscle joint tightness, decreased coordination and decreased motor planning, decreased midline orientation and decreased attention to left, decreased initiation, decreased attention, decreased awareness, decreased problem solving, decreased safety awareness, decreased memory and delayed processing and decreased sitting balance, decreased standing balance, decreased postural control, hemiplegia and decreased balance strategies and therefore will continue to benefit from skilled OT intervention to enhance overall performance with BADL and iADL.  Patient progressing toward long term goals..  Continue plan of care.  OT Short Term Goals Week 1:  OT Short Term Goal 1 (Week 1): Pt will complete 3/3 components of toileting tasks with no more than Min balance assist OT Short Term Goal 1 - Progress (Week 1): Met OT Short Term Goal 2 (Week 1): Pt will complete toilet transfer with Min A and LRAD OT Short Term Goal 2 - Progress (Week 1): Progressing toward goal OT Short Term Goal 3 (Week 1): Pt will complete an ADL session sit<stand at the sink to increase OOB tolerance OT Short Term Goal 3 - Progress  (Week 1): Met Week 2:  OT Short Term Goal 1 (Week 2): Pt will complete toilet transfer with min A OT Short Term Goal 2 (Week 2): Pt will don LB clothing with min A with no LOB while seated/standing OT Short Term Goal 3 (Week 2): Pt will require no more than min cueing for initiation during ADL routine  Skilled Therapeutic Interventions/Progress Updates:    Pt received supine with no c/o pain, pointing to shower. Encouragement for verbal expression throughout session. Pt completed bed mobility with min A to EOB, preferring to pull on hand of OT despite cueing to use bedrail/bed. Pt completed sit > stand with mod A. Mod A for HHA ambulatory transfer into the bathroom. Pt voided urine and completed clothing management and hygiene with min A. Pt transferred into shower with min A. She washed UB with BUE, min HOH for thoroughness of washing under the RUE with the LUE. She stood for peri hygiene, min A for standing balance support. Cueing for use of figure 4 technique to wash feet. Cueing for incorporation of the LUE throughout bathing and for bimanual tasks like wringing out wash cloth's and pumping soap. Pt transferred back to w/c and donned shirt with CGA. Pants donned with min A. Cueing for positioning the BLE during dressing. Oral care completed with cueing for thoroughness. Pt was taken to the therapy gym where she transferred to the mat and into tall kneeling with mod A. She was able to maintain tall kneeling with BUE weightbearing through forearms with CGA. Cueing for pelvis rotation and glute activation. Several activities completed in this position to promote increased core, hip, and glute stability/strength needed for dynamic balance during  ADLs. Pt also maintained quadruped position with min facilitation at the L elbow to promote extension. Pt required several rest breaks. Pt returned to her room and transferred back to bed. She was left supine with her mother now present.   Therapy  Documentation Precautions:  Precautions Precautions: Fall Precaution Comments: PEG + abdominal binder to protect PEG, healed trach site, Lt elbow splint for nighttime use Restrictions Weight Bearing Restrictions: No  Therapy/Group: Individual Therapy  Curtis Sites 09/13/2020, 6:28 AM

## 2020-09-14 ENCOUNTER — Inpatient Hospital Stay (HOSPITAL_COMMUNITY): Payer: PRIVATE HEALTH INSURANCE | Admitting: Occupational Therapy

## 2020-09-14 ENCOUNTER — Inpatient Hospital Stay (HOSPITAL_COMMUNITY): Payer: PRIVATE HEALTH INSURANCE | Admitting: Speech Pathology

## 2020-09-14 ENCOUNTER — Inpatient Hospital Stay (HOSPITAL_COMMUNITY): Payer: PRIVATE HEALTH INSURANCE

## 2020-09-14 LAB — GLUCOSE, CAPILLARY
Glucose-Capillary: 118 mg/dL — ABNORMAL HIGH (ref 70–99)
Glucose-Capillary: 87 mg/dL (ref 70–99)
Glucose-Capillary: 89 mg/dL (ref 70–99)

## 2020-09-14 MED ORDER — TRAZODONE HCL 50 MG PO TABS
100.0000 mg | ORAL_TABLET | Freq: Every day | ORAL | Status: DC
  Administered 2020-09-14: 100 mg
  Filled 2020-09-14: qty 2
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
## 2020-09-14 NOTE — Progress Notes (Signed)
Speech Language Pathology Daily Session Note  Patient Details  Name: Tami Brown MRN: [id number] Date of Birth: 1996-01-22  Today's Date: 09/14/2020 SLP Individual Time: 0805-0900 SLP Individual Time Calculation (min): 55 min  Short Term Goals: Week 2: SLP Short Term Goal 1 (Week 2): Patient will utilize verbal expression to express wants/needs at the phrase level instead of the use of gesture in 75% of opportunities with Mod verbal cues. SLP Short Term Goal 2 (Week 2): Patient will utilize speech intelligibility strategies (slow rate, over articulation) at the phrase level to achieve~80% intelligibility with Mod verbal cues. SLP Short Term Goal 3 (Week 2): Patient will demonstrate functional problem solving for basic and familiar tasks with Min verbal cues. SLP Short Term Goal 4 (Week 2): Patient will demonstrate selective attention in a mildly distracting enviornment for 45 minutes with Min verbal cues for redirection. SLP Short Term Goal 5 (Week 2): Patient will recall new, daily information with Min verbal cues. SLP Short Term Goal 6 (Week 2): Patient will consume trials of ice chips with minimal overt s/s of aspiration and Min verbal cues to assess readiness for repeat MBS.  Skilled Therapeutic Interventions: Skilled treatment session focused on cognitive and dysphagia goals. SLP facilitated session by providing Mod A verbal cues for initiation and overall sustained attention during functional tasks. Patient performed oral care via the suction toothbrush with set-up assist and consumed trials of small teaspoon sips of honey-thick liquids. Patient consumed trials with minimal overt s/s of aspiration but appeared to demonstrate prolonged AP transit. Recommend repeat MBS 1/13. SLP also upgraded patient's EMST device to 45 cm H2O and IMST to 17 cm H2O in which patient performed 25 repetitions of each with Mod verbal cues for accuracy. Mod verbal cues were also needed for use of verbal expression  to express wants/needs and for use of speech intelligibility strategies. Patient left upright in bed with alarm on and all needs within reach. Continue with current plan of care.      Pain Pain Assessment Pain Scale: 0-10 Pain Score: 0-No pain  Therapy/Group: Individual Therapy  Chi Woodham 09/14/2020, 10:04 AM

## 2020-09-14 NOTE — Patient Care Conference (Signed)
Inpatient RehabilitationTeam Conference and Plan of Care Update Date: 09/14/2020   Time: 10:46 AM    Patient Name: Tami Brown      Medical Record Number: [id number]  Date of Birth: March 12, 1996 Sex: Female         Room/Bed: 4W24C/4W24C-01 Payor Info: Payor: [employer]CIAL / Plan: GENERIC COMMERCIAL / Product Type: *No Product type* /    Admit Date/Time:  09/03/2020  3:28 PM  Primary Diagnosis:  TBI (traumatic brain injury) [facility])  Hospital Problems: Principal Problem:   TBI (traumatic brain injury) (HCC) Active Problems:   Sleep disturbance   Hyperglycemia   Malnutrition of moderate degree    Expected Discharge Date: Expected Discharge Date: 09/24/20  Team Members Present: Physician leading conference: Dr. Faith Rogue Care Coodinator Present: Cecile Sheerer, LCSWA;Jonnathan Birman Marlyne Beards, RN, BSN, CRRN Nurse Present: Adam Phenix, LPN PT Present: Malachi Pro, PT OT Present: Kearney Hard, OT SLP Present: Feliberto Gottron, SLP PPS Coordinator present : Fae Pippin, SLP     Current Status/Progress Goal Weekly Team Focus  Bowel/Bladder     Continent x2   remain continent   assess for s/s of infections  Swallow/Nutrition/ Hydration   NPO with PEG, trials of ice chips, Min A  Min A  ongoing trials, RMT, functional tasks for coordination of oral musculature, MBS this week   ADL's   Min A UB ADLs, Min/Mod A LB ADLs, Min A transfers  Supervision to min A  self-care retraining, activity tolerance, L attention, L U NMR   Mobility   supervision to CGA for bed mobility and transfers, occasional modA for ambulation due to LOBs  Supervision bed mobility and transfers, minA ambulation  balance, core strength, hip strength, ambulation   Communication   Mod-Max A for use of verbal expression vs gestures and for use of speech intelligibility strategies  Supervision  use of verbal expression, use of a slow rate, RMT   Safety/Cognition/ Behavioral Observations  Mod A  Supervision   attention, initiation, problem solving, recall   Pain     no current complaints of pain   3/10 pain   assess pain q 4 hr and prn  Skin     peg site CDI   no new breakdown   assess skin q shift and prn    Discharge Planning:  Pt to d/c to home with her parents who will provide 24/7 care. Pt has out of state insurance. Pt would benefit from outpatient therapies at d/c as pt will be unable to get [facility] due to insurance.   Team Discussion: Patient can ambulate to the bathroom, continent B/B. To go home with mom and dad, insurance will be a barrier as it is out of state insurance. Patient on target to meet rehab goals: She is min assist with ADL's. She is mod assist with ambulation. She is supervision for bed mobility and transfers, min assist for ambulation. SLP is working on Systems developer and RMT. She has supervision goals. She will have a MBS on Thursday.   *See Care Plan and progress notes for long and short-term goals.   Revisions to Treatment Plan:  MBS scheduled for Thursday.  Teaching Needs: Family education, gait training, transfer training, medication management, etc.  Current Barriers to Discharge: Decreased caregiver support, Home enviroment access/layout, Wound care, Lack of/limited family support, Weight bearing restrictions, Medication compliance and Behavior  Possible Resolutions to Barriers: Continue current medications, provide emotional support to patient and family.     Medical Summary Current Status: still  aphonic, remains NPO, working on phonation/using voice. pain improved. cognitively has made nice progress  Barriers to Discharge: Medical stability   Possible Resolutions to [employer] Focus: daily lab and data review. pain mgt, maximize use of pharyngeal/laryngeal musculature   Continued Need for Acute Rehabilitation Level of Care: The patient requires daily medical management by a physician with specialized training in physical medicine and  rehabilitation for the following reasons: Direction of a multidisciplinary physical rehabilitation program to maximize functional independence : Yes Medical management of patient stability for increased activity during participation in an intensive rehabilitation regime.: Yes Analysis of laboratory values and/or radiology reports with any subsequent need for medication adjustment and/or medical intervention. : Yes   I attest that I was present, lead the team conference, and concur with the assessment and plan of the team.   Tennis Must 09/14/2020, 5:36 PM

## 2020-09-14 NOTE — Progress Notes (Signed)
Physical Therapy Session Note  Patient Details  Name: Tami Brown MRN: [id number] Date of Birth: Nov 25, 1995  Today's Date: 09/14/2020 PT Individual Time: 4503-8882 PT Individual Time Calculation (min): 44 min   Short Term Goals: Week 2:  PT Short Term Goal 1 (Week 2): Pt will require supervision w bed mobility PT Short Term Goal 2 (Week 2): Pt will perform safe stand pivot transfers consistently w/min assist PT Short Term Goal 3 (Week 2): Gait 133ft w/min assist and LRAD PT Short Term Goal 4 (Week 2): pt will ascend/descend 4 5in stairs w/2 rails w/min assist and minimal cueing for safety  Skilled Therapeutic Interventions/Progress Updates:     Pt received supine in bed and agrees to therapy. Supine to sit with minA. PT provides minA at trunk while pt doffs socks and PT provides assistance to don shoes for time management purposes. Stand pivot transfer to [facility] with modA due to posterior lean. WC transport to gym for time management. Pt performs forward transfer from [facility] to mat, with PT providing minA and pt crawling onto mat on hands and knees to perform NMR for core and hip strength. Pt assisted into tall kneeling position with bilateral upper extremity support on platform, and mirror positioned for visual feedback. PT provides CGA/minA at hips for stability and as tactile cueing for extension and midline positioning. Pt performs alternate upper extremity support to engage core, progressing to no upper extremity support. Pt then positioned in forward flexion with forearms on platform and cued to extend trunk with back extensors and glutes to achieve upright positioning, 1x10. Pt then assisted into quadruped positioning and begins to alternate extending each leg with modA from PT. Pt noted to have PEG tube leak during activity. Assisted back to [facility] with minA and WC transport back to room. Pt performs multiple reps of sit to stand in bathroom with minA and use of grab bars, with PT assisting to change  pants and underwear. Pt able to perform single leg stance to remove pants with minA from PT. Pt performs sit to supine with supervision and cues for positoinin. Left supine in bed with alarm intact and all needs within reach.  Therapy Documentation Precautions:  Precautions Precautions: Fall Precaution Comments: PEG + abdominal binder to protect PEG, healed trach site, Lt elbow splint for nighttime use Restrictions Weight Bearing Restrictions: No   Therapy/Group: Individual Therapy  Beau Fanny, PT, DPT 09/14/2020, 4:30 PM

## 2020-09-14 NOTE — Progress Notes (Signed)
Occupational Therapy Session Note  Patient Details  Name: Tami Brown MRN: [id number] Date of Birth: Apr 25, 1996  Today's Date: 09/14/2020  Session 1 OT Individual Time: 1000-1030 OT Individual Time Calculation (min): 30 min    Session 2 OT Individual Time: 3419-3790 OT Individual Time Calculation (min): 75 min    Short Term Goals: Week 2:  OT Short Term Goal 1 (Week 2): Pt will complete toilet transfer with min A OT Short Term Goal 2 (Week 2): Pt will don LB clothing with min A with no LOB while seated/standing OT Short Term Goal 3 (Week 2): Pt will require no more than min cueing for initiation during ADL routine  Skilled Therapeutic Interventions/Progress Updates:    Session 1 Pt greeted semi-reclined in bed and agreeable to OT treatment session. Pt ambulated 5 ft in room with Min HHA. Pt brought to therapy gym and used UE Ergometer to work on L  UE strength. Pt completed 2 minutes using B UE's then 1 minute isolating L UE. Pt needed guided A to get L UE all the way through. Pt completed 8 minutes of this. Pt returned to room and ambulated 5 feet back to bed in similar fashion. Pt left semi-reclined in bed with bed alarm on, call bell in reach, and needs met.   Session 2 Pt greeted semi-reclined in bed and agreeable to shower. Pt completed bed mobility with min A. She needed min/mod A to ambulated into bathroom. Pt needed mod A for balance when turning to sit on shower chair. Encouragement to try to doff clothing herself. Increased time needed, but able to doff clothes with min A. Bathing completed with focus on use of L UE during bathing tasks. Min A sit<>stand and min A for balance when standing to wash buttocks. Stand-pivot out of shower with Min A. Dressing completed from wc at the sink with pt needing cues for hemi dressing techniques. Cues to integrate L UE to pull up pants as well. Grooming tasks at the sink with continued focus on L fine motor control to open containers. OT  encouraged verbalizations throughout session, but pt would only answer with one word responses. Pt brought down to therapy gym and worked on functional use of L UE with graded clothes pin task on vertical pole to promote L shoulder flexion. OT also played pt preferred music during session. Pt's mother reported that pt having difficulty sleeping. Pt reported she gets on her phone when she wakes up in the middle of the night. OT encouraged pt to leave her phone alone at night. OT also went into pt's phone settings and set phone to move to night mode from 9pm-7am. The night mode removes blue light from the phone. Pt returned to room and ambulated 5 feet back to bed with min A. Pt left semi-reclined in bed with bed alarm on, mother present, and needs met.   Therapy Documentation Precautions:  Precautions Precautions: Fall Precaution Comments: PEG + abdominal binder to protect PEG, healed trach site, Lt elbow splint for nighttime use Restrictions Weight Bearing Restrictions: No Pain:   denies pain  Therapy/Group: Individual Therapy  Valma Cava 09/14/2020, 1:46 PM

## 2020-09-14 NOTE — Progress Notes (Signed)
Patient ID: Tami Brown, female   DOB: 08-28-1996, 25 y.o.   MRN: [id number]  SW met with pt and pt parents in room to provide updates from team conference, and gains made in rehab. Family continues to report they are currently looking for a home that is one level, and have not been able to get a ground level apartment as there are not any available. SW to provide letter to help assist with possible transfer if this option becomes available.   SW updated medical team on pt may require to walk stairs and asked if stairs can be practiced with patient.   Loralee Pacas, MSW, [place] Office: [phone] Cell: [phone] Fax: [phone]
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
## 2020-09-14 NOTE — Progress Notes (Signed)
Pt has rested well through out the night. No complaints at this time, denies pain, and no obvious signs of distress.

## 2020-09-14 NOTE — Progress Notes (Signed)
Pt was assisted to the [facility]. Pt is currently on her menstrual cycle. Feminine care products provided to the pt, and pt was able to perform peri care on her self. Pt denies being in pain.

## 2020-09-15 ENCOUNTER — Inpatient Hospital Stay (HOSPITAL_COMMUNITY): Payer: PRIVATE HEALTH INSURANCE

## 2020-09-15 ENCOUNTER — Inpatient Hospital Stay (HOSPITAL_COMMUNITY): Payer: PRIVATE HEALTH INSURANCE | Admitting: Speech Pathology

## 2020-09-15 LAB — GLUCOSE, CAPILLARY
Glucose-Capillary: 134 mg/dL — ABNORMAL HIGH (ref 70–99)
Glucose-Capillary: 76 mg/dL (ref 70–99)
Glucose-Capillary: 79 mg/dL (ref 70–99)
Glucose-Capillary: 85 mg/dL (ref 70–99)

## 2020-09-15 MED ORDER — QUETIAPINE FUMARATE 25 MG PO TABS
25.0000 mg | ORAL_TABLET | Freq: Every day | ORAL | Status: DC
  Administered 2020-09-15 – 2020-09-17 (×3): 25 mg via ORAL
  Filled 2020-09-15 (×3): qty 1

## 2020-09-15 MED ORDER — QUETIAPINE FUMARATE 25 MG PO TABS
25.0000 mg | ORAL_TABLET | Freq: Every evening | ORAL | Status: DC | PRN
  Administered 2020-09-18: 25 mg via ORAL
  Filled 2020-09-15: qty 1

## 2020-09-15 MED ORDER — AMANTADINE HCL 50 MG/5ML PO SOLN
100.0000 mg | Freq: Every day | ORAL | Status: DC
  Administered 2020-09-16 – 2020-09-24 (×9): 100 mg
  Filled 2020-09-15 (×9): qty 10
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
## 2020-09-15 NOTE — Progress Notes (Signed)
Patient ID: Tami Brown, female   DOB: 09-May-1996, 25 y.o.   MRN: [id number]   SW spoke with Vernona Rieger Flowers/[facility] ([phone]/f:[phone]) about enteral feeds for patient in the event this is needed. Reports will run pt insurance to see if this would be approved considering pt has out [place] Medicaid it is not likely. SW faxed over clinical notes. SW waiting on follow-up.  SW spoke with Nicholos Johns Holmes/RD to discuss possible alternatives for TF in the event this is not covered under pt insurance. States to wait for MBS to be completed tomorrow before making any recommendations. SW to follow-up pending MBS.  Cecile Sheerer, MSW, LCSWA Office: [phone] Cell: [phone] Fax: [phone]

## 2020-09-15 NOTE — Progress Notes (Signed)
Pt c/o not being able to sleep. Pt currently has PRN order for sleep medications, but not effective. Provider will be made aware.

## 2020-09-15 NOTE — Progress Notes (Signed)
Physical Therapy Session Note  Patient Details  Name: Tami Brown MRN: [id number] Date of Birth: 07/22/1996  Today's Date: 09/15/2020 PT Individual Time: 6060-0459 PT Individual Time Calculation (min): 73 min   Short Term Goals: Week 2:  PT Short Term Goal 1 (Week 2): Pt will require supervision w bed mobility PT Short Term Goal 2 (Week 2): Pt will perform safe stand pivot transfers consistently w/min assist PT Short Term Goal 3 (Week 2): Gait 182ft w/min assist and LRAD PT Short Term Goal 4 (Week 2): pt will ascend/descend 4 5in stairs w/2 rails w/min assist and minimal cueing for safety  Skilled Therapeutic Interventions/Progress Updates:     Pt received supine and agrees to therapy. No complaint of pain but requesting to use restroom. Supine to sit with bed features and cues on sequencing. PT assists pt to don shoes at EOB for time management and pt able to maintain sitting balance with stand by assist. Pt ambulates to bathroom with minA at hips for stability and performs toilet transfer with minA. Independent for pericare. Stand step to WC with minA. WC transport to gym for time management.   Pt performs stair training on 6" steps. PT performs demonstration prior to pt attempt. Pt utilizes bilateral hand rails and PT provides minA at hips with consistent cueing to perform step sequencing correctly. Pt completes x16 steps, then performs L lower extremity strengthening and NMR by stepping up x1 step with L leg first and stepping back with R leg first to encourage L sided WB. PT provides manual facilitation of slight L knee bend to prevent hyperextension and increase muscular activation, x10 reps.  Pt performs sidestepping in parallel bars facing mirror for visual feedback. Emphasis on L hip abduction and stance control on L side. Pt progresses to sidestepping over cones to increase difficulty as well as encourage increased stance time on L leg. Pt initially uses bilateral upper extremities  for support and mainly relies on R upper extremity. Progression to performing with only L upper extremity support and R hand in pocket with PT manually facilitating non-use of R hand due to pt tendency to reach for bar when standing on L leg. PT providing minA overall for activity.  Pt performs NMR on mat table in quadruped position. MinA to achieve position. Pt then performs targeted peg placement with R hand to encourage WB through L hand and core stabilization. PT providing minA/modA at trunk and hips and to help stabilize L elbow and shoulder while WB. Pt then transitions to high kneeling position facing mirror. Able to remain in high kneeling with Stand by assist. Pt then performs trunk rotations holding onto 1 kg medicine ball, laterally and then in diagonal plane in both directions. PT providing minA to stabilize ball due to L upper extremity weakness, but pt does not require assist with balance.  Pt performs gait training, with instruction to ambulate 100' as carefully as possible with extra attention on L leg and stability. First rep pt requires modA for x5 LOBs, primarily to the L. 2nd rep requires modA for x3 LOBs, and 3rd reps requires modA for x2 LOBs. Improved performance with cues to maintain upright gaze and increase gait speed.   WC transport back to room. Stand step transfer to bed with CGA. Left supine in bed with alarm intact and all needs within reach.  Therapy Documentation Precautions:  Precautions Precautions: Fall Precaution Comments: PEG + abdominal binder to protect PEG, healed trach site, Lt elbow splint for nighttime  use Restrictions Weight Bearing Restrictions: No  Therapy/Group: Individual Therapy  Beau Fanny, PT, DPT 09/15/2020, 3:57 PM

## 2020-09-15 NOTE — Progress Notes (Signed)
Occupational Therapy Session Note  Patient Details  Name: Tami Brown MRN: [id number] Date of Birth: May 07, 1996  Today's Date: 09/15/2020 OT Individual Time: 0681-6619 OT Individual Time Calculation (min): 58 min    Short Term Goals: Week 3:     Skilled Therapeutic Interventions/Progress Updates:     Pt received in bed with no pain reported agreeable to shower. Pt transfers into w/c with MIN A via stand pivot ADL:  Pt completes bathing with min A overall for standing blance with VC for WB LUE on grab bar. Pt requires question cues to intiate manipulating water temp/turning on/off Pt completes UB dressing with MIN A to pull overhead during donning and doffing without VC for hemi dressing Pt completes LB dressing with MIN A Overall with VC for hemi dressing and VC for problem solving as pt is not able to figure out alternative way to thread LLE and tries same method 3x with no success Pt completes footwear with S to don socks and MOD A to don shoes with VC for using loop in back of shoe to pull up heel Pt completes toileting with MIN A for clothing management pas L hip in standing and VC for ant/post midline orientation Pt completes toileting transfer with MIN A fia stand pivot  Pt completes shower/Tub transfer with min A via stand pivot Therapeutic activity 9HPT  RUE: 31 sec LUE: 53 sec  Box and Blocks RUE: 31 LUE: 22  Pt left at end of session in bed with exit alarm on, call light in reach and all needs met   Therapy Documentation Precautions:  Precautions Precautions: Fall Precaution Comments: PEG + abdominal binder to protect PEG, healed trach site, Lt elbow splint for nighttime use Restrictions Weight Bearing Restrictions: No General:   Vital Signs: Therapy Vitals Pulse Rate: 71 Resp: 18 BP: (!) 105/59 Patient Position (if appropriate): Lying Oxygen Therapy SpO2: 99 % O2 Device: Room Air Pain:   ADL: ADL Eating: Not assessed Grooming: Moderate  assistance Where Assessed-Grooming: Edge of bed Upper Body Bathing: Moderate assistance Where Assessed-Upper Body Bathing: Edge of bed Lower Body Bathing: Moderate assistance Where Assessed-Lower Body Bathing: Edge of bed Upper Body Dressing: Contact guard Where Assessed-Upper Body Dressing: Edge of bed Lower Body Dressing: Maximal assistance Where Assessed-Lower Body Dressing: Edge of bed Toileting: Moderate assistance Where Assessed-Toileting: Bedside Commode Toilet Transfer: Moderate assistance Toilet Transfer Method: Stand pivot Social research officer, government: Not assessed Vision   Perception    Praxis   Exercises:   Other Treatments:     Therapy/Group: Individual Therapy  Tonny Branch 09/15/2020, 6:43 AM

## 2020-09-15 NOTE — Progress Notes (Signed)
Speech Language Pathology Daily Session Note  Patient Details  Name: Tami Brown MRN: [id number] Date of Birth: Jan 21, 1996  Today's Date: 09/15/2020 SLP Individual Time: 1015-1110 SLP Individual Time Calculation (min): 55 min  Short Term Goals: Week 2: SLP Short Term Goal 1 (Week 2): Patient will utilize verbal expression to express wants/needs at the phrase level instead of the use of gesture in 75% of opportunities with Mod verbal cues. SLP Short Term Goal 2 (Week 2): Patient will utilize speech intelligibility strategies (slow rate, over articulation) at the phrase level to achieve~80% intelligibility with Mod verbal cues. SLP Short Term Goal 3 (Week 2): Patient will demonstrate functional problem solving for basic and familiar tasks with Min verbal cues. SLP Short Term Goal 4 (Week 2): Patient will demonstrate selective attention in a mildly distracting enviornment for 45 minutes with Min verbal cues for redirection. SLP Short Term Goal 5 (Week 2): Patient will recall new, daily information with Min verbal cues. SLP Short Term Goal 6 (Week 2): Patient will consume trials of ice chips with minimal overt s/s of aspiration and Min verbal cues to assess readiness for repeat MBS.  Skilled Therapeutic Interventions: Skilled treatment session focused on dysphagia and cognitive goals. SLP facilitated session by providing Min verbal cues for performance of 25 repetitions with the EMST/IMST devices. SLP also provided supervision verbal cues for problem solving with oral care via the suction toothbrush.  Patient consumed trials of ice chips with consistent overt s/s of aspiration. Patient is scheduled for repeat MBS tomorrow to assess swallow function. SLP also facilitated session by providing Mod A verbal cues for sustained attention and functional problem solving during a 6 step picture sequencing task. Mod verbal cues were needed for use of complete sentences to maximize length of utterance and for a  slow rate to maximize intelligibility. Mod verbal cues were also needed due to intermittent oral holding of saliva. Patient left upright in bed with alarm on and all needs within reach. Continue with current plan of care.      Pain No/Denies Pain   Therapy/Group: Individual Therapy  Tami Brown 09/15/2020, 4:07 PM

## 2020-09-16 ENCOUNTER — Inpatient Hospital Stay (HOSPITAL_COMMUNITY): Payer: PRIVATE HEALTH INSURANCE

## 2020-09-16 ENCOUNTER — Inpatient Hospital Stay (HOSPITAL_COMMUNITY): Payer: Medicaid Other

## 2020-09-16 ENCOUNTER — Inpatient Hospital Stay (HOSPITAL_COMMUNITY): Payer: PRIVATE HEALTH INSURANCE | Admitting: Occupational Therapy

## 2020-09-16 ENCOUNTER — Encounter (HOSPITAL_COMMUNITY): Payer: PRIVATE HEALTH INSURANCE | Admitting: Speech Pathology

## 2020-09-16 LAB — BASIC METABOLIC PANEL
Anion gap: 10 (ref 5–15)
BUN: 13 mg/dL (ref 6–20)
CO2: 24 mmol/L (ref 22–32)
Calcium: 9.1 mg/dL (ref 8.9–10.3)
Chloride: 104 mmol/L (ref 98–111)
Creatinine, Ser: 0.58 mg/dL (ref 0.44–1.00)
GFR, Estimated: >60 mL/min (ref >60)
Glucose, Bld: 92 mg/dL (ref 70–99)
Potassium: 4.1 mmol/L (ref 3.5–5.1)
Sodium: 138 mmol/L (ref 135–145)

## 2020-09-16 LAB — GLUCOSE, CAPILLARY
Glucose-Capillary: 106 mg/dL — ABNORMAL HIGH (ref 70–99)
Glucose-Capillary: 132 mg/dL — ABNORMAL HIGH (ref 70–99)
Glucose-Capillary: 78 mg/dL (ref 70–99)
Glucose-Capillary: 90 mg/dL (ref 70–99)

## 2020-09-16 LAB — PREALBUMIN: Prealbumin: 24.5 mg/dL (ref 18–38)

## 2020-09-16 NOTE — Progress Notes (Signed)
Modified Barium Swallow Progress Note  Patient Details  Name: Tami Brown MRN: [id number] Date of Birth: [birth year]  Today's Date: 09/16/2020  Modified Barium Swallow completed.  Full report located under Chart Review in the Imaging Section.  Brief recommendations include the following:  Clinical Impression  Patient's overall swallowing function appears improved since last MBS, however, patient continues to demonstrate a moderate oropharyngeal dysphagia. Oral phase is characterized by decreased bolus cohesion, prolonged AP transit, tongue pumping and piecemeal spillage into the pharynx with inconsistent timing of swallow response. With thin consistencies, patient demonstrated premature spillage resulting in aspiration before and during the swallow. Aspiration led to a cough response but was unable to clear from airway. Postural changes were also unsuccessful in eliminating aspiration events.  Patient also demonstrated prolonged mastication with piecemeal swallowing of solid textures. Recommend patient initiate a diet of Dys. 1 textures with nectar-thick liquids with full supervision. Patient educated regarding results of MBS but will need reinforcement.   Swallow Evaluation Recommendations       SLP Diet Recommendations: Dysphagia 1 (Puree) solids;Nectar thick liquid   Liquid Administration via: Cup   Medication Administration: Via alternative means   Supervision: Patient able to self feed;Full supervision/cueing for compensatory strategies   Compensations: Minimize environmental distractions;Slow rate;Small sips/bites;Monitor for anterior loss   Postural Changes: Seated upright at 90 degrees   Oral Care Recommendations: Oral care BID   Other Recommendations: Order thickener from pharmacy;Prohibited food (jello, ice cream, thin soups);Remove water pitcher    Mertha Clyatt 09/16/2020,4:03 PM

## 2020-09-16 NOTE — Progress Notes (Signed)
Physical Therapy Session Note  Patient Details  Name: Tami Brown MRN: [id number] Date of Birth: [birth year]  Today's Date: 09/16/2020 PT Individual Time: 2694-8546 and 2703-5009 PT Individual Time Calculation (min): 55 min and 58 min  Short Term Goals: Week 2:  PT Short Term Goal 1 (Week 2): Pt will require supervision w bed mobility PT Short Term Goal 2 (Week 2): Pt will perform safe stand pivot transfers consistently w/min assist PT Short Term Goal 3 (Week 2): Gait 171ft w/min assist and LRAD PT Short Term Goal 4 (Week 2): pt will ascend/descend 4 5in stairs w/2 rails w/min assist and minimal cueing for safety  Skilled Therapeutic Interventions/Progress Updates:     1st Session: Pt received supine in bed and agrees to therapy. Supine to sit with supervision and use of bed features with cues on positioning. Stand pivot transfer to [facility] with minA and slight LOB backward. PT provides cues for anterior weight shift and positioning for transfer. WC transport to gym for time management. Transfer to Nustep with CGA. Pt performs Nustep activity for strength and endurance training with focus on reciprocal coordination. Pt completes x18:00 total with x1 rest break. Workload of 4 with SPM >35.   Pt ambulates 175' with instruction that goal of activity is to maintain balance. Pt ambulates primarily with CGA but has x3 LOBs requiring minA/modA to prevent fall. Cues provided for upright gaze and increasing gait speed to improve balance.  Pt completes 2x20 TKEs with L leg for strengthening and increased proprioceptive input. PT uses Level 2 Theraband and PT provides multimodal cueing for proper body mechanics and performance. WC transport back to room and transfer to bed with CGA. Left supine in bed with alarm intact and all needs within reach.  2nd Session: Pt received supine in bed with family present. Agrees to therapy and no complaint of pain. Pt performs supine to sit from flat bed with bedrails and  supervision. Pt attempts sit to stand several times and cannot achieve due to posterior lean. PT cues on sequencing and body mechanics and pt performs Sit to stand with minA and stand step transfer to toilet with minA. Following toileting, pt performs pericare independently and is able to pull up pants with max verbal cues but no physical assistance. WC transport to gym for time management. Pt "crawls" forward onto mat table to position in high kneeling with minA. Pt performs NMR for core strength, hip strength, and balance, rotating to L and reaching with L hand for clothespins then placing on basketball net overhead.PT provides CGA and cues for sequencing but pt does not require assistance for balance. Pt then performs multiple gait trials, 5x100'. Initial bout without AD and with minA for balance at hips. 2nd bout pt performs with 3lb weight on L leg for proprioceptive input. 3rd bout with 3lb weight on R leg to encourage increased lateral weight shift to the L to clear R foot. Last two bouts pt performs withi ace wrap on L leg to facilitate dorsiflexion and eversion. All bouts requires minA/modA for occasional LOBs. WC transport back to room. Stand step transfer back to bed with supervision. Pt left supine in bed with alarm intact and all needs within reach.  Therapy Documentation Precautions:  Precautions Precautions: Fall Precaution Comments: PEG + abdominal binder to protect PEG, healed trach site, Lt elbow splint for nighttime use Restrictions Weight Bearing Restrictions: No   Therapy/Group: Individual Therapy  Beau Fanny, PT, DPT 09/16/2020, 3:59 PM
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
## 2020-09-16 NOTE — Progress Notes (Signed)
Occupational Therapy Session Note  Patient Details  Name: Tami Brown MRN: [id number] Date of Birth: 04-06-1996  Today's Date: 09/16/2020 OT Individual Time: 1300-1345 OT Individual Time Calculation (min): 45 min    Short Term Goals: Week 2:  OT Short Term Goal 1 (Week 2): Pt will complete toilet transfer with min A OT Short Term Goal 2 (Week 2): Pt will don LB clothing with min A with no LOB while seated/standing OT Short Term Goal 3 (Week 2): Pt will require no more than min cueing for initiation during ADL routine  Skilled Therapeutic Interventions/Progress Updates:    patient in bed, alert - able to whisper her age and answers questions with encouragement (low volume/whisper)  She denies pain and requests to take a shower and change her clothes this session.  She is pleasant and cooperative t/o session.  Supine to sitting edge of bed with CS.  Sit to stand CGA - ambulation with hand hold assist min/mod A to/from shower bench.  Min a to [employer].  Shower completed seated on shower bench min A for thoroughness with right arm and bilateral lower legs, CGA to maintain balance in stance while she washes peri area and buttocks.  Dressing completed seated on w/c surface - min A for OH shirt, min A for incontinence brief and pants, she is able to pull over hips with CGA.  Donning socks with set up, mod/max A for shoes.  Completed left shoulder and elbow stretch, AAROM in stance on door surface to increase OH reach and proximal stretch with excellent effort.  Ambulation back to bed with min A, sit to supine CS.   Bed alarm set and call bell in reach at close of session.  Mom present.    Therapy Documentation Precautions:  Precautions Precautions: Fall Precaution Comments: PEG + abdominal binder to protect PEG, healed trach site, Lt elbow splint for nighttime use Restrictions Weight Bearing Restrictions: No  Therapy/Group: Individual Therapy  Barrie Lyme 09/16/2020, 7:39 AM

## 2020-09-16 NOTE — Progress Notes (Signed)
Patient ID: Tami Brown, female   DOB: 19-Jan-1996, 25 y.o.   MRN: [id number]  SW returned phone call/left message for Tami Brown/[facility] Case Manager [phone]) who inquired about pt progression here while in rehab in which they felt pt has progressed to return back to a facility in [place] either air travel or ground transportation; and indicated they would be limited on the number of days they could approve for pt while she is here. SW informed on pt d/c date 1/21, and informed on reports being pt was going to stay in [place] and encouraged follow-up with pt parents to get more insight.   SW called pt mother Tami Brown to discuss formal family education. Scheduled for Wed 1/19 10am-3pm with mother/father. SW informed waiting on updates from medical team on if pt will transition to a diet or will continue to require TF. SW indicated will follow-up once there is more information.  Cecile Sheerer, MSW, LCSWA Office: [phone] Cell: [phone] Fax: [phone]

## 2020-09-17 ENCOUNTER — Inpatient Hospital Stay (HOSPITAL_COMMUNITY): Payer: PRIVATE HEALTH INSURANCE | Admitting: Occupational Therapy

## 2020-09-17 ENCOUNTER — Inpatient Hospital Stay (HOSPITAL_COMMUNITY): Payer: PRIVATE HEALTH INSURANCE | Admitting: Speech Pathology

## 2020-09-17 ENCOUNTER — Inpatient Hospital Stay (HOSPITAL_COMMUNITY): Payer: PRIVATE HEALTH INSURANCE

## 2020-09-17 LAB — GLUCOSE, CAPILLARY
Glucose-Capillary: 110 mg/dL — ABNORMAL HIGH (ref 70–99)
Glucose-Capillary: 119 mg/dL — ABNORMAL HIGH (ref 70–99)
Glucose-Capillary: 151 mg/dL — ABNORMAL HIGH (ref 70–99)
Glucose-Capillary: 90 mg/dL (ref 70–99)
Glucose-Capillary: 93 mg/dL (ref 70–99)

## 2020-09-17 LAB — CREATININE, SERUM
Creatinine, Ser: 0.56 mg/dL (ref 0.44–1.00)
GFR, Estimated: >60 mL/min (ref >60)

## 2020-09-17 MED ORDER — OSMOLITE 1.5 CAL PO LIQD
355.0000 mL | Freq: Three times a day (TID) | ORAL | Status: DC
  Administered 2020-09-17 – 2020-09-21 (×14): 355 mL
  Filled 2020-09-17 (×4): qty 474

## 2020-09-17 NOTE — Plan of Care (Signed)
  Problem: Consults Goal: RH BRAIN INJURY PATIENT EDUCATION Description: Description: See Patient Education module for eduction specifics Outcome: Progressing   Problem: RH SAFETY Goal: RH STG ADHERE TO SAFETY PRECAUTIONS W/ASSISTANCE/DEVICE Description: STG Adhere to Safety Precautions With supervision/cues Assistance/Device. Outcome: Progressing   Problem: RH COGNITION-NURSING Goal: RH STG USES MEMORY AIDS/STRATEGIES W/ASSIST TO PROBLEM SOLVE Description: STG Uses Memory Aids/Strategies With supervision/reminders Assistance to Problem Solve. Outcome: Progressing   Problem: RH PAIN MANAGEMENT Goal: RH STG PAIN MANAGED AT OR BELOW PT'S PAIN GOAL Description: At or below level 4 Outcome: Progressing   Problem: RH KNOWLEDGE DEFICIT BRAIN INJURY Goal: RH STG INCREASE KNOWLEDGE OF SELF CARE AFTER BRAIN INJURY Description: Caregivers will be able to manage care at discharge using handouts and educational materials independently Outcome: Progressing Goal: RH STG INCREASE KNOWLEDGE OF DYSPHAGIA/FLUID INTAKE Description: Patient's caregivers will be able to manage nutritional means with dysphagia limitations using educational materials independently Outcome: Progressing
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
## 2020-09-17 NOTE — Progress Notes (Signed)
Speech Language Pathology Weekly Progress and Session Note  Patient Details  Name: Tami Brown MRN: [id number] Date of Birth: March 31, 1996  Beginning of progress report period: September 10, 2020 End of progress report period: September 17, 2020  Today's Date: 09/17/2020 SLP Individual Time: 1100-1155 SLP Individual Time Calculation (min): 55 min  Short Term Goals: Week 2: SLP Short Term Goal 1 (Week 2): Patient will utilize verbal expression to express wants/needs at the phrase level instead of the use of gesture in 75% of opportunities with Mod verbal cues. SLP Short Term Goal 1 - Progress (Week 2): Met SLP Short Term Goal 2 (Week 2): Patient will utilize speech intelligibility strategies (slow rate, over articulation) at the phrase level to achieve~80% intelligibility with Mod verbal cues. SLP Short Term Goal 2 - Progress (Week 2): Met SLP Short Term Goal 3 (Week 2): Patient will demonstrate functional problem solving for basic and familiar tasks with Min verbal cues. SLP Short Term Goal 3 - Progress (Week 2): Met SLP Short Term Goal 4 (Week 2): Patient will demonstrate selective attention in a mildly distracting enviornment for 45 minutes with Min verbal cues for redirection. SLP Short Term Goal 4 - Progress (Week 2): Met SLP Short Term Goal 5 (Week 2): Patient will recall new, daily information with Min verbal cues. SLP Short Term Goal 5 - Progress (Week 2): Not met SLP Short Term Goal 6 (Week 2): Patient will consume trials of ice chips with minimal overt s/s of aspiration and Min verbal cues to assess readiness for repeat MBS. SLP Short Term Goal 6 - Progress (Week 2): Met    New Short Term Goals: Week 3: SLP Short Term Goal 1 (Week 3): Patient will utilize verbal expression to express wants/needs at the phrase level instead of the use of gesture in 75% of opportunities with Min verbal cues. SLP Short Term Goal 2 (Week 3): Patient will utilize speech intelligibility strategies (slow  rate, over articulation) at the phrase level to achieve~80% intelligibility with Min verbal cues. SLP Short Term Goal 3 (Week 3): Patient will demonstrate functional problem solving for mildly complex and familiar tasks with Mod verbal cues. SLP Short Term Goal 4 (Week 3): Patient will demonstrate selective attention in a mildly distracting enviornment for 60 minutes with Min verbal cues for redirection. SLP Short Term Goal 5 (Week 3): Patient will recall new, daily information with Min verbal cues. SLP Short Term Goal 6 (Week 3): Patient will consume current diet with minimal overt s/s of aspiration with Min verbal cues for use of swallowing compensatory strategies.  Weekly Progress Updates: Patient has made functional gains and has met 5 of 6 STGs this reporting period. Currently, patient demonstrates behaviors consistent with a Rancho Level VI-VII and requires overall Min-Mod A verbal cues to complete functional and familiar tasks safely in regards to attention, problem solving, awareness and recall. Mod verbal cues are also needed for use of speech intelligibility strategies at the phrase level and for use of verbal expression vs gestures to communicate wants/needs. Patient had a repeat MBS on 1/13 with continued aspiration of thin liquids and prolonged mastication with mild-moderate oral dysphagia. Therefore, patient started on Dys. 1 textures with nectar-thick liquids with full supervision. Patient and family eduction ongoing. Patient would benefit from continued skilled SLP intervention to maximize her cognitive and swallowing function as well as her functional communication prior to discharge.     Intensity: Minumum of 1-2 x/day, 30 to 90 minutes Frequency: 3 to 5 out  of 7 days Duration/Length of Stay: 09/24/20 Treatment/Interventions: Cognitive remediation/compensation;Cueing hierarchy;Dysphagia/aspiration precaution training;Patient/family education;Oral motor exercises;Therapeutic  Activities;Therapeutic Exercise;Speech/Language facilitation;Multimodal communication approach;Functional tasks   Daily Session  Skilled Therapeutic Interventions: Skilled treatment session focused on dysphagia and cognitive goals. SLP facilitated session by providing skilled observation with a snack of Dys. 1 textures with nectar-thick liquids. Patient consumed the snack without overt s/s of aspiration and required minimal encouragement for PO intake. Recommend patient continue current diet. SLP also facilitated session by providing Mod-Max A verbal and visual cues for problem solving and error awareness during a mildly complex calendar task. Patient left upright in bed with alarm on and all needs within reach. Continue with current plan of care.      Pain No/Denies Pain   Therapy/Group: Individual Therapy  Rajvi Armentor 09/17/2020, 6:40 AM

## 2020-09-17 NOTE — Progress Notes (Signed)
Occupational Therapy Session Note  Patient Details  Name: Tami Brown MRN: [id number] Date of Birth: 1996/06/17  Today's Date: 09/17/2020 OT Individual Time: 0906-1000 OT Individual Time Calculation (min): 54 min    Short Term Goals: Week 2:  OT Short Term Goal 1 (Week 2): Pt will complete toilet transfer with min A OT Short Term Goal 2 (Week 2): Pt will don LB clothing with min A with no LOB while seated/standing OT Short Term Goal 3 (Week 2): Pt will require no more than min cueing for initiation during ADL routine  Skilled Therapeutic Interventions/Progress Updates:      Pt seen for BADL retraining of toileting, bathing, and dressing with a focus on balance, use of LUE and LLE, problem solving. Pt's L foot was moving into inversion when standing so did not feel comfortable trying to ambulate to shower, used wc to move from bed to toilet to shower.  Min A with transfers with cues for left foot placement to ensure her foot was flat on floor prior to stepping.  In shower,cues to use L hand and pt was able to do so fairly well.  She was having difficulty to lift and internally rotate LLE so needed A to wash foot and don shoes.  She did need occasional cues for problem solving.  CGA to min A when standing for LB self care due to truncal ataxia.   Focused on voice volume. Pt did well speaking vs pointing today but still needed some cues such as when picking out clothing, to say the color she wanted vs pointing. Her volume is still very low but above a whisper.   Core strengthening exercises in bed with bent knee twists squeezing pillow between knees to activate obliques and isometric bridge holds with pillow squeezes. PROM to L elbow, no muscle tension felt, instructed pt in self ROM with overhead arm extension with B hands clasped.   Pt resting in bed with alarm set and all needs met.   Therapy Documentation Precautions:  Precautions Precautions: Fall Precaution Comments: PEG + abdominal  binder to protect PEG, healed trach site, Lt elbow splint for nighttime use Restrictions Weight Bearing Restrictions: No    Vital Signs: Therapy Vitals Pulse Rate: (!) 102 BP: (!) 115/55 Pain: Pain Assessment Pain Score: 0-No pain   Therapy/Group: Individual Therapy  [place] 09/17/2020, 11:28 AM

## 2020-09-17 NOTE — Progress Notes (Signed)
Physical Therapy Session Note  Patient Details  Name: Tami Brown MRN: [id number] Date of Birth: [birth year]  Today's Date: 09/17/2020 PT Individual Time: 1030-1314 PT Individual Time Calculation (min): 54 min   Short Term Goals: Week 2:  PT Short Term Goal 1 (Week 2): Pt will require supervision w bed mobility PT Short Term Goal 2 (Week 2): Pt will perform safe stand pivot transfers consistently w/min assist PT Short Term Goal 3 (Week 2): Gait 146ft w/min assist and LRAD PT Short Term Goal 4 (Week 2): pt will ascend/descend 4 5in stairs w/2 rails w/min assist and minimal cueing for safety  Skilled Therapeutic Interventions/Progress Updates:     Pt received supine in bed and agrees to thereapy. No report of pain. Supine to sit with cues on sequencing. Pt performs sit to stand with minA due to posterior lean, and stand step transfer to [facility] with minA. WC transport to gym for time management. Pt trials PLS AFO on L foot due to flexor tone in L foot and ankle. Pt ambulates bouts of 300' x2 with consistent minA due to ataxic gait and slight LOBs, but with improved gait pattern relative to previous sessions without AFO. Pt verbalizes that she feels the AFO improves her balance as well. Pt then performs NMR for L leg balance and strengthening, performing repeated step-ups on 5 inch step, ascending with L leg and descending backward with R leg first. PT provides multimodal cueing for increased hip flexion to transition center of gravity over BOS, due to pt tendency for extensor tone and consistent LOBs backward. PT provides consistent minA and occasional maxA due to LOBs, especially when descending backward. With repeated reps and cueing, pt eventually performs several repetitions with CGA and no LOBs. WC transport back to room. Stand step transfer to bed with close supervision and cues on positioning. Left supine with alarm intact and all needs within reach.  Therapy Documentation Precautions:   Precautions Precautions: Fall Precaution Comments: PEG + abdominal binder to protect PEG, healed trach site, Lt elbow splint for nighttime use Restrictions Weight Bearing Restrictions: No    Therapy/Group: Individual Therapy  Beau Fanny, PT, DPT 09/17/2020, 3:54 PM
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
## 2020-09-17 NOTE — Progress Notes (Signed)
Patient ID: Tami Brown, female   DOB: 29-Aug-1996, 25 y.o.   MRN: [id number]  SW received message from Vernona Rieger Flowers/[facility] [phone]) stating pt in network, and formula and supplies are covered at 100%. States will need order to submit for approval; unsure if it will be approved since pt current provider is in [place] versus [place] which is the location of her managed Medicaid plan.   SW spoke with Katie/RD who reported will put in new orders for supplemental TF for pt since now on diet. Osomlite 1.5- 6 cartons per day with free water flushes of q 6hrs. SW will wait for note. SW left message for Laura/[facility] to inform will send order once note has been entered. SW faxed order and updated RD note to Laura/[facility].   Cecile Sheerer, MSW, LCSWA Office: [phone] Cell: [phone] Fax: [phone]

## 2020-09-18 ENCOUNTER — Inpatient Hospital Stay (HOSPITAL_COMMUNITY): Payer: PRIVATE HEALTH INSURANCE | Admitting: Speech Pathology

## 2020-09-18 ENCOUNTER — Inpatient Hospital Stay (HOSPITAL_COMMUNITY): Payer: PRIVATE HEALTH INSURANCE

## 2020-09-18 LAB — GLUCOSE, CAPILLARY
Glucose-Capillary: 88 mg/dL (ref 70–99)
Glucose-Capillary: 109 mg/dL — ABNORMAL HIGH (ref 70–99)

## 2020-09-18 MED ORDER — MELATONIN 3 MG PO TABS
3.0000 mg | ORAL_TABLET | Freq: Every day | ORAL | Status: DC
  Administered 2020-09-18 – 2020-09-23 (×6): 3 mg
  Filled 2020-09-18 (×6): qty 1

## 2020-09-18 MED ORDER — QUETIAPINE FUMARATE 25 MG PO TABS: 25.0000 mg | ORAL_TABLET | Freq: Every evening | ORAL | Status: DC | PRN

## 2020-09-18 MED ORDER — QUETIAPINE FUMARATE 25 MG PO TABS
25.0000 mg | ORAL_TABLET | Freq: Every day | ORAL | Status: DC
  Administered 2020-09-18 – 2020-09-23 (×6): 25 mg
  Filled 2020-09-18 (×6): qty 1

## 2020-09-18 MED ORDER — MELATONIN 3 MG PO TABS: 3.0000 mg | ORAL_TABLET | Freq: Every day | ORAL | Status: DC

## 2020-09-18 MED ORDER — METHOCARBAMOL 500 MG PO TABS
500.0000 mg | ORAL_TABLET | Freq: Three times a day (TID) | ORAL | Status: DC
  Administered 2020-09-18 – 2020-09-19 (×3): 500 mg
  Filled 2020-09-18 (×3): qty 1

## 2020-09-18 NOTE — Progress Notes (Signed)
Speech Language Pathology Daily Session Note  Patient Details  Name: Tami Brown MRN: [id number] Date of Birth: 1996-01-12  Today's Date: 09/18/2020 SLP Individual Time: 0250-0330 SLP Individual Time Calculation (min): 40 min  Short Term Goals: Week 3: SLP Short Term Goal 1 (Week 3): Patient will utilize verbal expression to express wants/needs at the phrase level instead of the use of gesture in 75% of opportunities with Min verbal cues. SLP Short Term Goal 2 (Week 3): Patient will utilize speech intelligibility strategies (slow rate, over articulation) at the phrase level to achieve~80% intelligibility with Min verbal cues. SLP Short Term Goal 3 (Week 3): Patient will demonstrate functional problem solving for mildly complex and familiar tasks with Mod verbal cues. SLP Short Term Goal 4 (Week 3): Patient will demonstrate selective attention in a mildly distracting enviornment for 60 minutes with Min verbal cues for redirection. SLP Short Term Goal 5 (Week 3): Patient will recall new, daily information with Min verbal cues. SLP Short Term Goal 6 (Week 3): Patient will consume current diet with minimal overt s/s of aspiration with Min verbal cues for use of swallowing compensatory strategies.  Skilled Therapeutic Interventions:  Patient seen by SLP to address speech, cognitive and swallow goals. Patient consumed approximately 2-3 ounces of nectar thick liquids via cup sips without overt s/s aspiration or penetration and did not demonstrate impulsive intake. She performed EMST 5 reps and reported difficulty as minimal so may consider increasing resistance. She continues to have a breathy voice and unable to achieve adequate vocal adduction during yawn-sigh and throat clear strategy. She completed divergent naming and would come up with about 5 different items before stopping but would think of more when prompted to continue. Responses were mainly at one-word level but increased to two-word when  prompted during describing and biographical question responses. Patient continues to benefit from skilled SLP intervention to maximize cognitive-linguistic, speech and swallow function prior to discharge.   Pain Pain Assessment Pain Scale: 0-10 Pain Score: 0-No pain  Therapy/Group: Individual Therapy  Angela Nevin, MA, CCC-SLP Speech Therapy
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
## 2020-09-18 NOTE — Plan of Care (Signed)
  Problem: Consults Goal: RH BRAIN INJURY PATIENT EDUCATION Description: Description: See Patient Education module for eduction specifics Outcome: Progressing   Problem: RH SAFETY Goal: RH STG ADHERE TO SAFETY PRECAUTIONS W/ASSISTANCE/DEVICE Description: STG Adhere to Safety Precautions With supervision/cues Assistance/Device. Outcome: Progressing   Problem: RH COGNITION-NURSING Goal: RH STG USES MEMORY AIDS/STRATEGIES W/ASSIST TO PROBLEM SOLVE Description: STG Uses Memory Aids/Strategies With supervision/reminders Assistance to Problem Solve. Outcome: Progressing   Problem: RH PAIN MANAGEMENT Goal: RH STG PAIN MANAGED AT OR BELOW PT'S PAIN GOAL Description: At or below level 4 Outcome: Progressing   Problem: RH KNOWLEDGE DEFICIT BRAIN INJURY Goal: RH STG INCREASE KNOWLEDGE OF SELF CARE AFTER BRAIN INJURY Description: Caregivers will be able to manage care at discharge using handouts and educational materials independently Outcome: Progressing Goal: RH STG INCREASE KNOWLEDGE OF DYSPHAGIA/FLUID INTAKE Description: Patient's caregivers will be able to manage nutritional means with dysphagia limitations using educational materials independently Outcome: Progressing   

## 2020-09-18 NOTE — Progress Notes (Signed)
Physical Therapy Weekly Progress Note  Patient Details  Name: Tami Brown MRN: [id number] Date of Birth: [birth year]  Beginning of progress report period: September 10, 2020 End of progress report period: September 18, 2020  Today's Date: 09/18/2020 PT Individual Time: 1345-1444 PT Individual Time Calculation (min): 59 min   Patient has met 3 of 4 short term goals.  Pt is progressing well toward mobility goals, improving independence with bed mobility, transfers, ambulation, and improving balance. Pt consistently performing bed mobility with supervision, transfers with CGA to minA, especially when distractions are limited and step by step cues are provided, and ambulation up to 150' with minA/modA. Pt is able to ambulate with CGA for bouts of time but continues to have LOBs requiring assistance to prevent fall.     Patient continues to demonstrate the following deficits muscle weakness, decreased cardiorespiratoy endurance, motor apraxia, ataxia and decreased coordination, decreased awareness and decreased safety awareness and decreased sitting balance, decreased standing balance, decreased postural control and decreased balance strategies and therefore will continue to benefit from skilled PT intervention to increase functional independence with mobility.  Patient progressing toward long term goals..  Continue plan of care.  PT Short Term Goals Week 2:  PT Short Term Goal 1 (Week 2): Pt will require supervision w bed mobility PT Short Term Goal 1 - Progress (Week 2): Met PT Short Term Goal 2 (Week 2): Pt will perform safe stand pivot transfers consistently w/min assist PT Short Term Goal 2 - Progress (Week 2): Met PT Short Term Goal 3 (Week 2): Gait 142f w/min assist and LRAD PT Short Term Goal 3 - Progress (Week 2): Progressing toward goal PT Short Term Goal 4 (Week 2): pt will ascend/descend 4 5in stairs w/2 rails w/min assist and minimal cueing for safety PT Short Term Goal 4 - Progress  (Week 2): Met Week 3:  PT Short Term Goal 1 (Week 3): STGs=LTGs  Skilled Therapeutic Interventions/Progress Updates:  Ambulation/gait training;Balance/vestibular training;Community reintegration;Cognitive remediation/compensation;Discharge planning;Disease management/prevention;Functional electrical stimulation;DME/adaptive equipment instruction;Neuromuscular re-education;Functional mobility training;Pain management;Patient/family education;Skin care/wound management;Psychosocial support;Splinting/orthotics;Stair training;Therapeutic Activities;UE/LE Strength taining/ROM;UE/LE Coordination activities;Therapeutic Exercise;Wheelchair propulsion/positioning;Visual/perceptual remediation/compensation   Pt received supine in bed and agrees to therapy. No complaint of pain. Supine to sit with cues for positioning and body mechanics. Pt dons R shoe with setup assistance and PT provides maxA to don L shoe, partially due to PLS AFO. Sit to stand with CGA and cues on body mechanics. Pt ambulates to gym x140' without AD and with minA for balance. Pt performs gait training in dayroom, x25', x35', x45', around cones and back, with PT providing CGA and no LOBs. Pt then performs dynamic gait in and out of cones and stepping over obstacles, requiring occasional minA to steady, primarily due to LOBs to the R as pt does not shift weight laterally to the L. Pt ambulates with 4lb ankle weight on R leg to encourage L lateral weight shift, x180' with PT providing consistent minA at hips to facilitate balance and symmetrical weight shifting. Pt performs NMR for balance and L leg strength, performing toe taps on cones with R leg. Pt instructed to tap two cones consecutively with R foot while maintaining stance on L leg. PT provides consistent minA and occasional modA, with multimodal cues for weight shifting and encourage pt to have slight flexion in L knee for improved muscular contraction and proprioceptive feedback. Pt ambulates  back to room, x180', with minA. Sit to supine supervision and pt left supine in bed with alarm  intact and all needs within reach.   Therapy Documentation Precautions:  Precautions Precautions: Fall Precaution Comments: PEG + abdominal binder to protect PEG, healed trach site, Lt elbow splint for nighttime use Restrictions Weight Bearing Restrictions: No   Therapy/Group: Individual Therapy  Breck Coons, PT, DPT 09/18/2020, 3:34 PM

## 2020-09-19 LAB — GLUCOSE, CAPILLARY
Glucose-Capillary: 100 mg/dL — ABNORMAL HIGH (ref 70–99)
Glucose-Capillary: 83 mg/dL (ref 70–99)

## 2020-09-19 MED ORDER — METHOCARBAMOL 500 MG PO TABS
500.0000 mg | ORAL_TABLET | Freq: Two times a day (BID) | ORAL | Status: DC
  Administered 2020-09-20: 500 mg
  Filled 2020-09-19: qty 1

## 2020-09-19 NOTE — Plan of Care (Signed)
  Problem: Consults Goal: RH BRAIN INJURY PATIENT EDUCATION Description: Description: See Patient Education module for eduction specifics Outcome: Progressing   Problem: RH SAFETY Goal: RH STG ADHERE TO SAFETY PRECAUTIONS W/ASSISTANCE/DEVICE Description: STG Adhere to Safety Precautions With supervision/cues Assistance/Device. Outcome: Progressing   Problem: RH COGNITION-NURSING Goal: RH STG USES MEMORY AIDS/STRATEGIES W/ASSIST TO PROBLEM SOLVE Description: STG Uses Memory Aids/Strategies With supervision/reminders Assistance to Problem Solve. Outcome: Progressing   Problem: RH PAIN MANAGEMENT Goal: RH STG PAIN MANAGED AT OR BELOW PT'S PAIN GOAL Description: At or below level 4 Outcome: Progressing   Problem: RH KNOWLEDGE DEFICIT BRAIN INJURY Goal: RH STG INCREASE KNOWLEDGE OF SELF CARE AFTER BRAIN INJURY Description: Caregivers will be able to manage care at discharge using handouts and educational materials independently Outcome: Progressing Goal: RH STG INCREASE KNOWLEDGE OF DYSPHAGIA/FLUID INTAKE Description: Patient's caregivers will be able to manage nutritional means with dysphagia limitations using educational materials independently Outcome: Progressing   
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
## 2020-09-20 ENCOUNTER — Inpatient Hospital Stay (HOSPITAL_COMMUNITY): Payer: PRIVATE HEALTH INSURANCE | Admitting: Occupational Therapy

## 2020-09-20 ENCOUNTER — Inpatient Hospital Stay (HOSPITAL_COMMUNITY): Payer: PRIVATE HEALTH INSURANCE | Admitting: Speech Pathology

## 2020-09-20 ENCOUNTER — Inpatient Hospital Stay (HOSPITAL_COMMUNITY): Payer: PRIVATE HEALTH INSURANCE

## 2020-09-20 LAB — GLUCOSE, CAPILLARY
Glucose-Capillary: 101 mg/dL — ABNORMAL HIGH (ref 70–99)
Glucose-Capillary: 104 mg/dL — ABNORMAL HIGH (ref 70–99)
Glucose-Capillary: 70 mg/dL (ref 70–99)
Glucose-Capillary: 84 mg/dL (ref 70–99)
Glucose-Capillary: 94 mg/dL (ref 70–99)
Glucose-Capillary: 96 mg/dL (ref 70–99)

## 2020-09-20 MED ORDER — METHOCARBAMOL 500 MG PO TABS: 500.0000 mg | ORAL_TABLET | Freq: Three times a day (TID) | ORAL | Status: DC | PRN

## 2020-09-20 NOTE — Progress Notes (Incomplete)
Physical Therapy Session Note  Patient Details  Name: Tami Brown MRN: [id number] Date of Birth: [birth year]  {CHL IP REHAB PT TIME CALCULATION:[id number]}  Short Term Goals: Week 3:  PT Short Term Goal 1 (Week 3): STGs=LTGs  Skilled Therapeutic Interventions/Progress Updates:     Pt received supine in bed and agrees to therapy. No complaint of pain. Spuine to sit with bedrails and cues on body mechanics. Pt performs sit to stand and stand pivot transfer to [facility] with CGA and cues on foot placement and sequencing. Stand pivot to Nustep with same assist. Pt completes Nustep activity for reciprocal coordination, strength and endurance training.  Pt tasked with ambulating without assistance, attempting to keep balance and ambulate with maximal steadiness. Pt able to ambulates 170' with very close supervision and without any LOBs. PT provides feedback on performance following bout. Pt ambulates with decreased weight shift to the L and increased time spent during R stance phase. Pt then ambulates x80', attempting to exagerate gait pattern in opposite manner, spending as much time as possible on L leg during stance phase.  Pt performs NMR for standing balance, specifically with emphasis on WB through L leg, with multitasking cognitive overlay. Pt stands with R foot propped on 10 inch step to limit WB, and participates in colored peg board task. PT provides min cuing for simple design and mod to max cuing for more complex design, with pt requiring cues for posture as well as seated rest break. Pt unable to accurately complete complex design in standing but in sitting is able to complete with cuing and increased time. WC transport back to room. Pt perform stand step transfer back to bed with close supervision. Left supine in bed with alarm intact and all needs within reach.  Therapy Documentation Precautions:  Precautions Precautions: Fall Precaution Comments: PEG + abdominal binder to protect PEG,  healed trach site, Lt elbow splint for nighttime use Restrictions Weight Bearing Restrictions: No    Therapy/Group: Individual Therapy  Beau Fanny, PT, DPT 09/20/2020, 3:43 PM
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
## 2020-09-20 NOTE — Progress Notes (Signed)
Speech Language Pathology Daily Session Note  Patient Details  Name: Tami Brown MRN: [id number] Date of Birth: Dec 09, 1995  Today's Date: 09/20/2020 SLP Individual Time: 2993-7169 SLP Individual Time Calculation (min): 55 min  Short Term Goals: Week 3: SLP Short Term Goal 1 (Week 3): Patient will utilize verbal expression to express wants/needs at the phrase level instead of the use of gesture in 75% of opportunities with Min verbal cues. SLP Short Term Goal 2 (Week 3): Patient will utilize speech intelligibility strategies (slow rate, over articulation) at the phrase level to achieve~80% intelligibility with Min verbal cues. SLP Short Term Goal 3 (Week 3): Patient will demonstrate functional problem solving for mildly complex and familiar tasks with Mod verbal cues. SLP Short Term Goal 4 (Week 3): Patient will demonstrate selective attention in a mildly distracting enviornment for 60 minutes with Min verbal cues for redirection. SLP Short Term Goal 5 (Week 3): Patient will recall new, daily information with Min verbal cues. SLP Short Term Goal 6 (Week 3): Patient will consume current diet with minimal overt s/s of aspiration with Min verbal cues for use of swallowing compensatory strategies.  Skilled Therapeutic Interventions: Skilled treatment session focused on cognitive goals. Patient performed 25 repetitions of EMST/IMST exercises with Mod I for accuracy. Due to patient's decreased effort level, patient's IMST device was increased to 21 cm H2O and her EMST device was increased to 50 cm H2O. SLP also facilitated session by providing extra time and overall Min A verbal cues for functional problem solving during a basic medication management task. SLP provided total A for recall of her current medications and their functions and initiated use of a QD pill box in which she required Min verbal cues for problem solving. Task will be completed during next session. Patient left upright in bed with  alarm on and all needs within reach. Continue with current plan of care.      Pain Pain Assessment Pain Scale: 0-10 Pain Score: 0-No pain  Therapy/Group: Individual Therapy  Tami Brown 09/20/2020, 12:50 PM

## 2020-09-20 NOTE — Progress Notes (Signed)
Occupational Therapy Session Note  Patient Details  Name: Tami Brown MRN: [id number] Date of Birth: June 18, 1996  Today's Date: 09/20/2020 OT Individual Time: 1000-1100 OT Individual Time Calculation (min): 60 min    Short Term Goals: Week 2:  OT Short Term Goal 1 (Week 2): Pt will complete toilet transfer with min A OT Short Term Goal 2 (Week 2): Pt will don LB clothing with min A with no LOB while seated/standing OT Short Term Goal 3 (Week 2): Pt will require no more than min cueing for initiation during ADL routine  Skilled Therapeutic Interventions/Progress Updates:    Pt received in bed agreeable to therapy.  Pt did want to shower, see ADL documentation below. Overall pt is CGA to min A.  She needed cues to speak vs. Pointing and to speak as loud as possible. Pt also cued to slide to edge of her seat (bed, wc, toilet, tub bench) and place L foot to enable it to be in a neutral position prior to standing. This helped prevent her foot from inverting and increased standing stability.   Pt taken to day room to work at elevated table in standing and pushing towel back and forth on table with LUE for sh flex and elb ext.  Rolling and squeezing towel for grasp strength. Peg puzzle activity which she followed well and was able to place pegs in foam board with L hand. Pt tolerated standing for 10 minutes.  Pt sat in w/c to work on overhead reach activity with weighted dowel with mod A to lift LUE overhead.  She has significant lordosis, cues to engage core.    Pt returned to room and got into bed, worked on ab crunches pushing weighted dowel from chest to knees with min A to curl her trunk forward.    Pt requested to stay in bed. Bed alarm set and all needs met.  Therapy Documentation Precautions:  Precautions Precautions: Fall Precaution Comments: PEG + abdominal binder to protect PEG, healed trach site, Lt elbow splint for nighttime use Restrictions Weight Bearing Restrictions:  No    Vital Signs: Therapy Vitals Pulse Rate: (!) 103 BP: 118/80 Patient Position (if appropriate): Sitting Oxygen Therapy SpO2: 100 % O2 Device: Room Air Pain: Pain Assessment Pain Scale: 0-10 Pain Score: 0-No pain ADL: ADL Eating: Not assessed Grooming: Supervision/safety Where Assessed-Grooming: Sitting at sink Upper Body Bathing: Supervision/safety,Minimal cueing Where Assessed-Upper Body Bathing: Shower Lower Body Bathing: Contact guard Where Assessed-Lower Body Bathing: Shower Upper Body Dressing: Supervision/safety Where Assessed-Upper Body Dressing: Wheelchair Lower Body Dressing: Minimal assistance Where Assessed-Lower Body Dressing: Wheelchair Toileting: Minimal assistance Where Assessed-Toileting: Glass blower/designer: Psychiatric nurse Method: Psychologist, educational: Environmental education officer Method: Radiographer, therapeutic: Grab bars,Transfer tub bench   Therapy/Group: Individual Therapy  [place] 09/20/2020, 12:19 PM

## 2020-09-21 ENCOUNTER — Inpatient Hospital Stay (HOSPITAL_COMMUNITY): Payer: PRIVATE HEALTH INSURANCE

## 2020-09-21 LAB — GLUCOSE, CAPILLARY
Glucose-Capillary: 110 mg/dL — ABNORMAL HIGH (ref 70–99)
Glucose-Capillary: 85 mg/dL (ref 70–99)
Glucose-Capillary: 87 mg/dL (ref 70–99)
Glucose-Capillary: 97 mg/dL (ref 70–99)

## 2020-09-21 NOTE — Progress Notes (Signed)
Physical Therapy Session Note  Patient Details  Name: Tami Brown MRN: [id number] Date of Birth: May 18, 1996  Today's Date: 09/21/2020 PT Individual Time: 0803-0859 PT Individual Time Calculation (min): 56 min   Short Term Goals: Week 3:  PT Short Term Goal 1 (Week 3): STGs=LTGs  Skilled Therapeutic Interventions/Progress Updates:     Pt received supine in bed and agrees to therapy. No complaint of pain. Supine to sit with supervision and cues for body mechanics. Pt dons socks and shoes at EOB with setup assist, verbal cues on sequencing, and increased time required to complete. Pt performs sit to stand and ambulatory transfer to toilet with CGA. Following, PT provides WC transport to gym for time management and energy conservation. Pt trials ambulation with RW this session, without use of PLS AFO on L foot. Initially pt stands with RW and uses mirror for visual feedback to attain optimal posture with symmetrical WB through both feet. Pt ambulates 200' x2 with RW and extended seated rest break. Pt requires consistent minA for safety with RW, demonstrating tendency to push walker far ahead of body and also tilting walker onto two legs several times. Pt then ambulates 100' without RW, requiring only CGA, though demonstrating decreased weight shift to the L and staggered gait pattern. PT provides demonstrating and cueing to correct deficits and pt ambulates additional bout of 100' with improving gait pattern.   Pt performs x24 steps with BHRs and CGA. PT provides cues on step sequencing and placement for safety, with occasional cues to attend to L arm. WC transport back to room. PT educates pt on importance of sitting out of bed during day and encourages pt to sit up at least 1 hour. Pt is agreeable. Left seated with alarm intact and all needs within reach.  Therapy Documentation Precautions:  Precautions Precautions: Fall Precaution Comments: PEG + abdominal binder to protect PEG, healed trach  site, Lt elbow splint for nighttime use Restrictions Weight Bearing Restrictions: No    Therapy/Group: Individual Therapy  Beau Fanny, PT, DPT 09/21/2020, 3:32 PM

## 2020-09-21 NOTE — Progress Notes (Addendum)
Patient ID: Tami Brown, female   DOB: 1996-05-29, 25 y.o.   MRN: [id number]  SW met with pt in room to provide updates from team conference on gains made, and encourage pt to eat more since she will have to d/c on TF. SW informed pt will update her on if this is covered, and will discuss with her parents as well.   SW left message for Tami Brown/[facility] [phone]) to follow-up about TF and if insurance will cover since physician is out of state. SW waiting on follow-up.   SW called pt mother Tami Brown to provide above updates. Will discuss pt care needs in further detail with pt present tomorrow. SW will hold off on ordering DME until confirming what is needed tomorrow.   Loralee Pacas, MSW, [place] Office: [phone] Cell: [phone] Fax: [phone]

## 2020-09-21 NOTE — Progress Notes (Signed)
Pt eating with family at this time. Will hold bolus feed until pt is finished eating. Communicated to night shift nurse that pt is still eating and to give bolus feed if needed.  Mylo Red, LPN

## 2020-09-21 NOTE — Progress Notes (Signed)
Speech Language Pathology Daily Session Note  Patient Details  Name: Tami Brown MRN: [id number] Date of Birth: Apr 27, 1996  Today's Date: 09/21/2020 SLP Individual Time: 0768-0881 SLP Individual Time Calculation (min): 58 min  Short Term Goals: Week 3: SLP Short Term Goal 1 (Week 3): Patient will utilize verbal expression to express wants/needs at the phrase level instead of the use of gesture in 75% of opportunities with Min verbal cues. SLP Short Term Goal 2 (Week 3): Patient will utilize speech intelligibility strategies (slow rate, over articulation) at the phrase level to achieve~80% intelligibility with Min verbal cues. SLP Short Term Goal 3 (Week 3): Patient will demonstrate functional problem solving for mildly complex and familiar tasks with Mod verbal cues. SLP Short Term Goal 4 (Week 3): Patient will demonstrate selective attention in a mildly distracting enviornment for 60 minutes with Min verbal cues for redirection. SLP Short Term Goal 5 (Week 3): Patient will recall new, daily information with Min verbal cues. SLP Short Term Goal 6 (Week 3): Patient will consume current diet with minimal overt s/s of aspiration with Min verbal cues for use of swallowing compensatory strategies.  Skilled Therapeutic Interventions: Skilled treatment session focused on swallowing and cognitive goals. Patient agreeable to limited trials Dys 2 solids with mildly extended mastication and decreased bolus prep/cohesion, apparent timely swallow initiation and no overt s/s aspiration. Pt with mild diffuse oral stasis after swallow effectively cleared with liquid wash. Pt denying further trials after 3 bites, pt encouraged to increase intake of advanced texture trials to determine fatigue factor and potential for diet upgrade. Pt with increased recall of current medications/functions 5/9 independently, continues to benefit from written information and education. Pt completing med management task with use of  QD pill box with only 3 pills placed in error. 1 errored pill self-identified and corrected, remaining 2 errored pills requiring min A verbal cues to ID and correct. Pt transferred from [facility] to bed, alarm set and all needs within reach. Cont ST POC.   Pain Pain Assessment Pain Scale: 0-10 Pain Score: 0-No pain  Therapy/Group: Individual Therapy  Tacey Ruiz 09/21/2020, 10:21 AM

## 2020-09-21 NOTE — Patient Care Conference (Signed)
Inpatient RehabilitationTeam Conference and Plan of Care Update Date: 09/21/2020   Time: 10:31 AM    Patient Name: Tami Brown      Medical Record Number: [id number]  Date of Birth: 05-01-1996 Sex: Female         Room/Bed: 4W24C/4W24C-01 Payor Info: Payor: GENERIC COMMERCIAL / Plan: GENERIC COMMERCIAL / Product Type: *No Product type* /    Admit Date/Time:  09/03/2020  3:28 PM  Primary Diagnosis:  TBI (traumatic brain injury) [facility])  Hospital Problems: Principal Problem:   TBI (traumatic brain injury) (HCC) Active Problems:   Sleep disturbance   Hyperglycemia   Malnutrition of moderate degree    Expected Discharge Date: Expected Discharge Date: 09/24/20  Team Members Present: Physician leading conference: Dr. Faith Rogue Care Coodinator Present: Cecile Sheerer, LCSWA;Caia Lofaro Marlyne Beards, RN, BSN, CRRN Nurse Present: Luevenia Maxin, LPN PT Present: Malachi Pro, PT OT Present: Towanda Malkin, OT SLP Present: Suzzette Righter, CF-SLP PPS Coordinator present : Edson Snowball, Park Breed, SLP     Current Status/Progress Goal Weekly Team Focus  Bowel/Bladder   Continent of B/B LBM 1/17  Pt will remain continent throughout stay.  q2 toileting, PRN meds for constipation   Swallow/Nutrition/ Hydration   Dys. 1 textues with nectar-thick liquids, Mod A  Min A  tolerance of current diet, trials of Dys. 2 textures   ADL's   CGA bathing, supervision/ set up UB dressing and oral care; min A LB dressing and toielting, min bathroom transfers  Supervision to min A  ADL training, balance, core strength, LUE NMR, pt education   Mobility   supervision bed mobility. Transfers supervision, occasional minA. Ambulation can be supervision/CGA and occasional miNA when distracted. MinA 12 steps with RHR.  Supervision bed mobility and transfers, minA ambulation  Family ed, balance, ambulation, DC prep   Communication   Mod A  Supervision  use of verbal expression, use of a slow rate, RMT    Safety/Cognition/ Behavioral Observations  Mod A  Supervision  attention, initiation, problem solving, recall   Pain   pt denies pain tonight.  Pt will remain free of pain.  assess pain qshift/ PRN; PRN pain meds   Skin   split gauze for PEG  Pt will not develop new skin breakdown.  assess skin qshift, educate pt on prevention of skin breakdown     Discharge Planning:  Pt to d/c to home with her parents who will provide 24/7 care. Pt has out of state insurance. Pt would benefit from outpatient therapies at d/c as pt will be unable to get [facility] due to insurance. Family edu scheduled 11/19 10am-3pm.   Team Discussion: Encourage patient to use voice. Patient is sleeping better. MD is backing off of the pain medications. Continent B/B, plan is to go home with parents. Parents are currently looking for a rental home that is one-level with no stairs. Patient on target to meet rehab goals: PT states that stairs are actually a good thing because she can use the rails and makes he focus more. She is getting better with ambulation but she needs to have limited distractions. She is contact guard assist with a shower, supervision to set-up for upper body bathing, dressing, and oral care. She had a MBS last week and is now on a dys1, nectar thick liquids. She doesn't like the food and continues to receive tube feed supplementation at night. She needs lots of cues to talk and communicate.   *See Care Plan and progress notes for long and  short-term goals.   Revisions to Treatment Plan:  MD is making adjustments to pain medications.  Teaching Needs: Family education, medication management, wound/skin care, gait training, stair training, transfer training, tube feed management.  Current Barriers to Discharge: Decreased caregiver support, Home enviroment access/layout, Wound care, Lack of/limited family support, Weight bearing restrictions, Behavior and Nutritional means  Possible Resolutions to  Barriers: Continue current medications, provide nutritional support, educate weight bearing precautions, provide emotional support to patient and family.     Medical Summary Current Status: still aphonic, eating sporadically mostly because she doesn't like food. cognitively showing improvement  Barriers to Discharge: Medical stability   Possible Resolutions to Barriers/Weekly Focus: ongoing nutritional mgt, daily labs and data assessment   Continued Need for Acute Rehabilitation Level of Care: The patient requires daily medical management by a physician with specialized training in physical medicine and rehabilitation for the following reasons: Direction of a multidisciplinary physical rehabilitation program to maximize functional independence : Yes Medical management of patient stability for increased activity during participation in an intensive rehabilitation regime.: Yes Analysis of laboratory values and/or radiology reports with any subsequent need for medication adjustment and/or medical intervention. : Yes   I attest that I was present, lead the team conference, and concur with the assessment and plan of the team.   Tennis Must 09/21/2020, 3:56 PM

## 2020-09-21 NOTE — Progress Notes (Signed)
Occupational Therapy Session Note  Patient Details  Name: Tami Brown MRN: [id number] Date of Birth: 16-Apr-1996  Today's Date: 09/21/2020 OT Individual Time: 1257-1411 OT Individual Time Calculation (min): 74 min    Short Term Goals: Week 2:  OT Short Term Goal 1 (Week 2): Pt will complete toilet transfer with min A OT Short Term Goal 2 (Week 2): Pt will don LB clothing with min A with no LOB while seated/standing OT Short Term Goal 3 (Week 2): Pt will require no more than min cueing for initiation during ADL routine  Skilled Therapeutic Interventions/Progress Updates:    Pt received supine with no c/o pain, pointing at shower when asked what she would like to do for session. Cueing for increased verbal expression required throughout session. She completed functional mobility into the bathroom with min HHA, progressing to CGA. She required min cueing for plantar flexion of her L foot intermittently. Toileting tasks completed with CGA, with continent urine void. She required cueing for initiation and independence with doffing clothing, often waiting for therapist to complete tasks for her. In shower, she washed UB with min A for thoroughness of reaching under her RUE with the L. LB completed with CGA sit <> stand and use of figure 4 technique to wash feet. Pt transferred out of shower and to EOB. Pants donned with min A for pulling up in standing. Shirt donned with supervision. Pt transferred back to supine and requested to have lunch tray (not yet delivered). Her tray was found and brought to room. Pt declined eating in w/c and instead ate in bed with HOB raised to 40 degrees. Pt required intermittent cueing for initiation during self feeding. One instance of pt coughing with too big a sip of nectar thick apple juice but none thereafter with cueing. CGA transfer to the w/c and set up assist for seated oral care at the sink. Pt was then taken to the therapy gym via w/c. She transferred to the mat  with CGA and transitioned into quadruped with min A. From here she was guided through various activities aimed at increasing weightbearing through the LUE. She required min facilitation for elbow extension, reporting some pain with full extension efforts. She then completed blocked practice of resistive sit > stands with level 1 theraband. She required CGA throughout. [name] returned to her room and was left supine with all needs met. Bed alarm set.   Therapy Documentation Precautions:  Precautions Precautions: Fall Precaution Comments: PEG + abdominal binder to protect PEG, healed trach site, Lt elbow splint for nighttime use Restrictions Weight Bearing Restrictions: No  Therapy/Group: Individual Therapy  Curtis Sites 09/21/2020, 6:41 AM
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
## 2020-09-22 ENCOUNTER — Ambulatory Visit (HOSPITAL_COMMUNITY): Payer: PRIVATE HEALTH INSURANCE

## 2020-09-22 ENCOUNTER — Inpatient Hospital Stay (HOSPITAL_COMMUNITY): Payer: PRIVATE HEALTH INSURANCE

## 2020-09-22 LAB — GLUCOSE, CAPILLARY
Glucose-Capillary: 110 mg/dL — ABNORMAL HIGH (ref 70–99)
Glucose-Capillary: 113 mg/dL — ABNORMAL HIGH (ref 70–99)
Glucose-Capillary: 149 mg/dL — ABNORMAL HIGH (ref 70–99)
Glucose-Capillary: 82 mg/dL (ref 70–99)

## 2020-09-22 MED ORDER — OSMOLITE 1.5 CAL PO LIQD
237.0000 mL | Freq: Three times a day (TID) | ORAL | Status: DC
  Administered 2020-09-23 – 2020-09-24 (×3): 237 mL
  Filled 2020-09-22: qty 237
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
## 2020-09-22 NOTE — Progress Notes (Signed)
Occupational Therapy Weekly Progress Note  Patient Details  Name: Tami Brown MRN: [id number] Date of Birth: 07/10/1996  Beginning of progress report period: September 13, 2020 End of progress report period: September 22, 2020   Patient has met 3 of 3 short term goals.  Pt continues to make excellent progress. She is now able to complete ADLs at [facility]- min A level overall. Her family is participating in family education and are able to provided necessary physical and cognitive assist recommended at d/c.   Patient continues to demonstrate the following deficits: muscle weakness and muscle joint tightness, abnormal tone, decreased coordination and decreased motor planning, decreased initiation, decreased attention, decreased awareness, decreased problem solving, decreased safety awareness, decreased memory and delayed processing and decreased standing balance, hemiplegia and decreased balance strategies and therefore will continue to benefit from skilled OT intervention to enhance overall performance with BADL and iADL.  Patient progressing toward long term goals..  Continue plan of care.  OT Short Term Goals Week 2:  OT Short Term Goal 1 (Week 2): Pt will complete toilet transfer with min A OT Short Term Goal 1 - Progress (Week 2): Met OT Short Term Goal 2 (Week 2): Pt will don LB clothing with min A with no LOB while seated/standing OT Short Term Goal 2 - Progress (Week 2): Met OT Short Term Goal 3 (Week 2): Pt will require no more than min cueing for initiation during ADL routine OT Short Term Goal 3 - Progress (Week 2): Met Week 3:  OT Short Term Goal 1 (Week 3): STG= LTG d/t ELOS   Curtis Sites 09/22/2020, 3:06 PM

## 2020-09-22 NOTE — Progress Notes (Signed)
Speech Language Pathology Daily Session Note  Patient Details  Name: Tami Brown MRN: [id number] Date of Birth: December 21, 1995  Today's Date: 09/22/2020 SLP Individual Time: 1100-1200 SLP Individual Time Calculation (min): 60 min  Short Term Goals: Week 3: SLP Short Term Goal 1 (Week 3): Patient will utilize verbal expression to express wants/needs at the phrase level instead of the use of gesture in 75% of opportunities with Min verbal cues. SLP Short Term Goal 2 (Week 3): Patient will utilize speech intelligibility strategies (slow rate, over articulation) at the phrase level to achieve~80% intelligibility with Min verbal cues. SLP Short Term Goal 3 (Week 3): Patient will demonstrate functional problem solving for mildly complex and familiar tasks with Mod verbal cues. SLP Short Term Goal 4 (Week 3): Patient will demonstrate selective attention in a mildly distracting enviornment for 60 minutes with Min verbal cues for redirection. SLP Short Term Goal 5 (Week 3): Patient will recall new, daily information with Min verbal cues. SLP Short Term Goal 6 (Week 3): Patient will consume current diet with minimal overt s/s of aspiration with Min verbal cues for use of swallowing compensatory strategies.  Skilled Therapeutic Interventions: Skilled treatment session focused on family education of swallowing, speech and cognition goals due to upcoming discharge home. Pt, mother and father present for education. SLP providing education regarding current swallow function, recommended diet and ongoing compensatory strategies during PO intake. Handouts provided for recommended foods for Dys 1 and Dys 2 diet as well as foods to avoid. Pt participating in ongoing trials of Dys 2 texture, mildly extended mastication but improved bolus cohesion and reduced oral stasis after swallow this date. Pt with no overt s/s aspiration with any trials Dys 2. SLP recommending Dys 1 diet for meals with Dys 2 diet for snacks at home  with ongoing diet texture analysis in OP ST setting to determine further advancement. SLP and parents completing thickening of liquids to Nectar consistency, parents with good demonstration of thickening 4oz liquids to Nectar consistency. SLP provided ongoing education/training for use of IMST and EMST devices as well as recommendations for continued use of trained compensatory strategies for speech intelligibility. Discussed current medication list and pt performance on management task previous session, pt encouraged to complete cognitive tasks at home with parent supervision to increase independence with daily routine. At this time, all patient/family questions answered. Pt left in bed with alarm set and family in room, cont ST POC.   Pain Pain Assessment Pain Scale: 0-10 Pain Score: 0-No pain  Therapy/Group: Individual Therapy  Tacey Ruiz 09/22/2020, 11:54 AM
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
## 2020-09-22 NOTE — Progress Notes (Addendum)
Patient ID: Tami Brown, female   DOB: Jul 18, 1996, 25 y.o.   MRN: [id number]  SW received updates form Mickel Baas [facility] [phone]) stating unable to get insurance authorization for MD physicians for TF/enteral feeds. States equivalent to Osmolite 1.5 is Nutren. Will get pricing for a case of Nutren, Prosource, and supplies. SW paged dietitian Gustavus Bryant [phone]) to provide updates and discuss if there are any other OTC options for patient.  Mickel Baas reports cost for Nutren 1.5 (equivalent of Osmolite 1.5) is $29.38 per case (24ct), Prostat $2.02 per packet, and syringe $4.00 and mails out one per week. SW paged [facility] to discuss further.   SW met with pt, pt mother and father in room to discuss above. Pt family aware SW waiting on alternative options for tube feeds. Family does understand that they will have to pay out of pocket for this expense. Through discussion about best location for services, family would like SW to send referral to [facility]Outpatient program [phone])  In the event they decide to transition back to [place]. SW explained with d/c date being Friday, a referral for outpatient services PT/OT/SLP to [facility].  SW paged Gustavus Bryant and on-call dietitian [phone]) and waiting on follow-up.   SW ordered DME: RW, TTB, 3in1 BSC  With [employer] via parachute.   *SW spoke with Kate/RD about above. Will put in new recommendations for alternative supplemental options for pt at d/c. SW will refer to note and will provide updates to pt.   Loralee Pacas, MSW, [place] Office: [phone] Cell: [phone] Fax: [phone]

## 2020-09-22 NOTE — Progress Notes (Signed)
Physical Therapy Session Note  Patient Details  Name: Tami Brown MRN: [id number] Date of Birth: September 04, 1996  Today's Date: 09/22/2020 PT Individual Time: 5277-8242 PT Individual Time Calculation (min): 71 min   Short Term Goals: Week 3:  PT Short Term Goal 1 (Week 3): STGs=LTGs  Skilled Therapeutic Interventions/Progress Updates:     Pt received supine in bed and agrees to therapy. Mother and father present for family education. Supine to sit with cues on positioning. Pt able to don shoes at EOB with minA. Sit to stand and ambulatory transfer to toilet with minA. WC transport to gym for time management. Pt performs car transfer following PT demo, with PT providing CGA and cues on sequencing. Pt then performs x2 additional reps with mother and father guarding pt, respectively. PT cues on hand placement and guarding technique for safety. Pt ambulates 250' without AD and with PT providing minA and cues for increased L weight shift and heel strike at initial contact, with increased eccentric control of knee extension. Following seated rest break, pt ambulates 250' with father providing assistance and cueing to correct gait deviations. Pt performs x4 steps with PT providing minA and use of LHR, and x4 steps with father providing assistance. Pt then performs exercises on mat for core and hip strengthening as well as to encourage WB through L hemibody. Pt achieves quadruped positioning with minA and practices alternating upper extremity support, with PT providing minA/modA to ensure proper body mechanics and to encourage L sided WB. Pt's father provides cueing as well, and pt alternates extending legs. Pt then positioned in high kneeling and performs arm raises, abduction and rotation to challenge dynamic balance and engage core muscles. Pt performs sit to stand and practices stepping forward and side toe taps with RLE and PT providing tactile cueing at L knee to facilitate proprioceptive feedback and  improved WB. WC transport back to room. Pt left seated in WC with alarm intact and all needs within reach.  Therapy Documentation Precautions:  Precautions Precautions: Fall Precaution Comments: PEG + abdominal binder to protect PEG, healed trach site, Lt elbow splint for nighttime use Restrictions Weight Bearing Restrictions: No    Therapy/Group: Individual Therapy  Beau Fanny, PT, DPT 09/22/2020, 4:02 PM

## 2020-09-22 NOTE — Progress Notes (Signed)
Occupational Therapy Session Note  Patient Details  Name: Tami Brown MRN: [id number] Date of Birth: 12/25/1995  Today's Date: 09/22/2020 OT Individual Time: 1010-1100 OT Individual Time Calculation (min): 50 min    Short Term Goals: Week 2:  OT Short Term Goal 1 (Week 2): Pt will complete toilet transfer with min A OT Short Term Goal 2 (Week 2): Pt will don LB clothing with min A with no LOB while seated/standing OT Short Term Goal 3 (Week 2): Pt will require no more than min cueing for initiation during ADL routine  Skilled Therapeutic Interventions/Progress Updates:    Family education session completed. Pt supine and agreeable to shower. Pt completing transfers throughout session with CGA, her parents were instructed in handling and safety awareness for fall risk reduction. Her parents were able to assist with all bathing and dressing at CGA level overall, with min A required for donning shoes. Cueing provided for safety, body mechanics, and allowing pt to complete as much independently as possibly. Demonstrated L UE AROM with good teachback. Pt left supine with all needs met, signed off parents for bathroom transfers via safety sheet.   Therapy Documentation Precautions:  Precautions Precautions: Fall Precaution Comments: PEG + abdominal binder to protect PEG, healed trach site, Lt elbow splint for nighttime use Restrictions Weight Bearing Restrictions: No  Therapy/Group: Individual Therapy  Curtis Sites 09/22/2020, 6:53 AM

## 2020-09-23 ENCOUNTER — Inpatient Hospital Stay (HOSPITAL_COMMUNITY): Payer: PRIVATE HEALTH INSURANCE | Admitting: Speech Pathology

## 2020-09-23 ENCOUNTER — Inpatient Hospital Stay (HOSPITAL_COMMUNITY): Payer: PRIVATE HEALTH INSURANCE

## 2020-09-23 ENCOUNTER — Inpatient Hospital Stay (HOSPITAL_COMMUNITY): Payer: PRIVATE HEALTH INSURANCE | Admitting: Occupational Therapy

## 2020-09-23 ENCOUNTER — Other Ambulatory Visit (HOSPITAL_COMMUNITY): Payer: Self-pay | Admitting: Physician Assistant

## 2020-09-23 LAB — GLUCOSE, CAPILLARY
Glucose-Capillary: 120 mg/dL — ABNORMAL HIGH (ref 70–99)
Glucose-Capillary: 61 mg/dL — ABNORMAL LOW (ref 70–99)
Glucose-Capillary: 81 mg/dL (ref 70–99)
Glucose-Capillary: 87 mg/dL (ref 70–99)
Glucose-Capillary: 91 mg/dL (ref 70–99)

## 2020-09-23 MED ORDER — METOPROLOL TARTRATE 25 MG PO TABS: ORAL_TABLET | ORAL | 11 refills | Status: AC

## 2020-09-23 MED ORDER — AMANTADINE HCL 50 MG/5ML PO SOLN: ORAL | 0 refills | Status: AC

## 2020-09-23 MED ORDER — METHYLPHENIDATE HCL 10 MG PO TABS: 10.0000 mg | ORAL_TABLET | Freq: Two times a day (BID) | ORAL | 0 refills | Status: AC

## 2020-09-23 MED ORDER — METOPROLOL TARTRATE 25 MG/10 ML ORAL SUSPENSION: ORAL | 0 refills | Status: DC

## 2020-09-23 MED ORDER — FOLIC ACID 1 MG PO TABS: 1.0000 mg | ORAL_TABLET | Freq: Every day | ORAL | 0 refills | Status: AC

## 2020-09-23 MED ORDER — OSMOLITE 1.5 CAL PO LIQD: 237.0000 mL | Freq: Three times a day (TID) | ORAL | 0 refills | Status: AC

## 2020-09-23 MED ORDER — PANTOPRAZOLE SODIUM 40 MG PO PACK: 40.0000 mg | PACK | Freq: Every day | ORAL | 0 refills | Status: AC

## 2020-09-23 MED ORDER — RESOURCE THICKENUP CLEAR PO POWD
ORAL | Status: DC | PRN
  Filled 2020-09-23: qty 125

## 2020-09-23 MED ORDER — RESOURCE THICKENUP CLEAR PO POWD: 5.0000 | ORAL | 1 refills | Status: AC | PRN

## 2020-09-23 MED ORDER — METHOCARBAMOL 500 MG PO TABS: 500.0000 mg | ORAL_TABLET | Freq: Three times a day (TID) | ORAL | 0 refills | Status: AC | PRN

## 2020-09-23 MED ORDER — ADULT MULTIVITAMIN W/MINERALS CH: 1.0000 | ORAL_TABLET | Freq: Every day | ORAL | Status: AC

## 2020-09-23 MED ORDER — QUETIAPINE FUMARATE 25 MG PO TABS: 25.0000 mg | ORAL_TABLET | Freq: Every day | ORAL | 0 refills | Status: DC

## 2020-09-23 MED ORDER — FREE WATER: 100.0000 mL | Freq: Four times a day (QID) | Status: AC

## 2020-09-23 MED ORDER — MELATONIN 3 MG PO TABS: 3.0000 mg | ORAL_TABLET | Freq: Every day | ORAL | 0 refills | Status: AC

## 2020-09-23 MED FILL — METHOCARBAMOL 500 MG TABS: 500 | 20 days supply | Qty: 60 | Fill #0

## 2020-09-23 MED FILL — MELATONIN 3 MG TABS: 3 | 30 days supply | Qty: 30 | Fill #0

## 2020-09-23 MED FILL — METHYLPHENIDATE 10 MG TAB: 10 | 30 days supply | Qty: 60 | Fill #0

## 2020-09-23 MED FILL — FOLIC ACID 1 MG TABS: 1 | 30 days supply | Qty: 30 | Fill #0

## 2020-09-23 MED FILL — AMANTADINE 50 MG/5 ML SYRUP: 50 | 30 days supply | Qty: 300 | Fill #0

## 2020-09-23 MED FILL — QUETIAPINE FUMARATE 25 MG T: 25 | 30 days supply | Qty: 30 | Fill #0

## 2020-09-23 MED FILL — PANTOPRAZOLE SODIUM 40 MG P: 40 | 30 days supply | Qty: 30 | Fill #0

## 2020-09-23 MED FILL — METOPROLOL TARTRATE 25 MG T: 25 | 30 days supply | Qty: 180 | Fill #0

## 2020-09-23 NOTE — Progress Notes (Signed)
Speech Language Pathology Discharge Summary  Patient Details  Name: Tami Brown MRN: [id number] Date of Birth: Nov 27, 1995  Today's Date: 09/23/2020 SLP Individual Time: 1100-1155 SLP Individual Time Calculation (min): 55 min   Skilled Therapeutic Interventions:  Skilled treatment session focused on speech, cognitive and dysphagia goals. Upon arrival, patient's call bell was on. Patient requested to use the bathroom and required Min verbal cues for attention to LUE throughout task. Patient was continent of bladder. SLP facilitated session by re-administering the [facility] Mental Status Examination (SLUMS). Patient scored 24 /30 points with a score of 27 or above considered normal. Patient continues to demonstrate deficits in verbal fluency, recall and problem solving. However, her score improved by 5 points since initial evaluation. Patient also completed 25 repetitions of both her IMST and EMST exercises with overall Mod I. Patient demonstrated improved verbal initiation today and even initiated asking clinician questions.  SLP provided skill observation with trials of Dys. 2 textures. Patient demonstrated mildly prolonged mastication with minimal oral residue without overt s/s of aspiration. Recommend patient continue current diet. Patient left upright in bed with alarm on and all needs within reach. Continue with current plan of care.    Patient has met 9 of 9 long term goals.  Patient to discharge at overall Supervision level.   Reasons goals not met: N/A   Clinical Impression/Discharge Summary: Patient has made excellent gains and has met 9 of 9 LTGs this admission. Currently, patient remains aphonic but demonstrates improved initiation of verbal expression and use of speech intelligibility strategies resulting in increased overall verbal expression at the phrase level. Patient also demonstrates improved swallow function and is consuming Dys. 1 textures with nectar-thick liquids  with minimal overt s/s of aspiration and overall supervision level verbal cues for use of swallowing compensatory strategies. Patient demonstrates behaviors consistent with a Rancho Level VII and requires overall supervision level verbal cues to complete functional and familiar tasks safely in regards to problem solving, attention, awareness and recall. Patient and family education is complete and patient will discharge home with 24 hour supervision from family. Patient would benefit from f/u SLP services to maximize her cognitive and swallowing function as well as her functional communication in order to maximize her overall functional independence and reduce caregiver burden.   Care Partner:  Caregiver Able to Provide Assistance: Yes  Type of Caregiver Assistance: Physical;Cognitive  Recommendation:  24 hour supervision/assistance;Outpatient SLP  Rationale for SLP Follow Up: Maximize functional communication;Reduce caregiver burden;Maximize cognitive function and independence;Maximize swallowing safety   Equipment: Thickener   Reasons for discharge: Discharged from hospital;Treatment goals met   Patient/Family Agrees with Progress Made and Goals Achieved: Yes    Azzam Mehra, [place] 09/23/2020, 7:14 AM

## 2020-09-23 NOTE — Progress Notes (Signed)
Occupational Therapy Session Note  Patient Details  Name: Tami Brown MRN: [id number] Date of Birth: September 13, 1995  Today's Date: 09/23/2020 OT Individual Time: 1300-1350 OT Individual Time Calculation (min): 50 min    Short Term Goals: Week 3:  OT Short Term Goal 1 (Week 3): STG= LTG d/t ELOS  Skilled Therapeutic Interventions/Progress Updates:    Patient in bed, alert and denies pain.  She requests a shower this session and is cooperative t/o session.   Supine to sitting edge of bed with CS.  Sit to stand and ambulation without AD to/from bed, commode, shower bench, w/c with CS/CGA.  She is able to doff clothing with CS, complete shower with CS, dressing completed seated edge of bed with CS - she requires CGA occ in stance for clothing management.  Reassessment for discharge completed as well this session - see discharge summary for details  Returned to bed at close of session, bed alarm set and call bell in hand.    Therapy Documentation Precautions:  Precautions Precautions: Fall Precaution Comments: PEG + abdominal binder to protect PEG, healed trach site, Lt elbow splint for nighttime use Restrictions Weight Bearing Restrictions: No  Therapy/Group: Individual Therapy  Barrie Lyme 09/23/2020, 7:40 AM

## 2020-09-23 NOTE — Progress Notes (Signed)
Pt ate 5% of meal and 52mL of fluids. Bolus given, meds given. Pt refused to eat more.  Mylo Red, LPN

## 2020-09-23 NOTE — Progress Notes (Signed)
Hypoglycemic Event  CBG: 61  Treatment: Supper meal tray   Symptoms: None  Follow-up CBG: Time: 1900 CBG Result: 91  Possible Reasons for Event: Inadequate meal intake 50% of lunch.  Comments/MD notified: N/A    Mylo Red LPN

## 2020-09-23 NOTE — Progress Notes (Addendum)
Patient ID: Tami Brown, female   DOB: June 01, 1996, 25 y.o.   MRN: [id number]  SW spoke with [facility] Free Bed/Outpatient program [phone]) to discuss if referral received. Reports does not see referral, however, it can take up to 3 days for processing.   SW spoke with pt mother Tami Brown [phone]) to give updates from dietitian. Family has opted to not purchase any of the formulas at this time, and will use the recommendations provided by SLP on items that can be made for patient. SW explained above about [facility] bed referral, and outpatient PT/OT/SLP order to sent to [facility]. SW also informed on DME order sent to [employer]. *SW later spoke with pt mother to inform DME and prescriptions will not be covered due to insurance being out of state Medicaid. SW explained pt was entered into [facility] medication assistance program, and charity DME for [employer]. SW reiterated being sure to discuss financial assistance with outpatient therapy location and upcoming hospital follow-up appointment with clinic. SW to leave Medicaid Application in room for family; family also encouraged to complete online as well.   *SW received message from pt mother requesting hospital bed for pt. SW later spoke with pt mother about request and informed waiting on updates from medical team on if this could be supported.   Update- Medical team unable to support hospital bed due to no mobility issues. SW called pt mother Tami Brown to inform.  Cecile Sheerer, MSW, LCSWA Office: [phone] Cell: [phone] Fax: [phone]

## 2020-09-23 NOTE — Progress Notes (Signed)
Occupational Therapy Discharge Summary  Patient Details  Name: Tami Brown MRN: [id number] Date of Birth: Apr 10, 1996     Patient has met 8 of 8 long term goals due to improved activity tolerance, improved balance, postural control, ability to compensate for deficits, functional use of  LEFT upper and LEFT lower extremity, improved attention, improved awareness and improved coordination.  Patient to discharge at overall Supervision level.  Patient's care partner is independent to provide the necessary physical and cognitive assistance at discharge.    Reasons goals not met: na  Recommendation:  Patient will benefit from ongoing skilled OT services in outpatient setting to continue to advance functional skills in the area of BADL, iADL, School/Education and Reduce care partner burden.  Equipment: 3 in 1 commode, shower bench  Reasons for discharge: treatment goals met  Patient/family agrees with progress made and goals achieved: Yes  OT Discharge Precautions/Restrictions  Precautions Precautions: Fall Precaution Comments: PEG Restrictions Weight Bearing Restrictions: No General   Vital Signs Therapy Vitals Temp: 99 F (37.2 C) Pulse Rate: 95 Resp: 16 BP: 114/71 Patient Position (if appropriate): Lying Oxygen Therapy SpO2: 100 % O2 Device: Room Air Pain Pain Assessment Pain Scale: 0-10 Pain Score: 0-No pain ADL ADL Eating: Supervision/safety Grooming: Supervision/safety Where Assessed-Grooming: Sitting at sink Upper Body Bathing: Supervision/safety Where Assessed-Upper Body Bathing: Shower Lower Body Bathing: Supervision/safety Where Assessed-Lower Body Bathing: Shower Upper Body Dressing: Setup Where Assessed-Upper Body Dressing: Edge of bed Lower Body Dressing: Supervision/safety Where Assessed-Lower Body Dressing: Edge of bed Toileting: Contact guard Where Assessed-Toileting: Glass blower/designer: Therapist, music Method:  Product/process development scientist Method: Heritage manager: Network engineer bench,Grab bars Vision Baseline Vision/History: Wears glasses Vision Assessment?: Yes Eye Alignment: Impaired (comment) (left lateral ROM impaired) Ocular Range of Motion: Restricted on the left Alignment/Gaze Preference: Within Defined Limits Tracking/Visual Pursuits: Decreased smoothness of horizontal tracking Saccades: Decreased speed of saccadic movement Convergence: Within functional limits Additional Comments: patient denies diplopia or blurred vision - left eye with lateral ROM deficit - ongoing assessment and monitoring noted Perception  Perception: Impaired Inattention/Neglect: Other (comment) (Decreased L sided attention) Praxis Praxis: Intact Cognition Overall Cognitive Status: Impaired/Different from baseline Arousal/Alertness: Awake/alert Orientation Level: Oriented X4 Awareness: Impaired Problem Solving: Impaired Safety/Judgment: Impaired Sensation Sensation Light Touch: Appears Intact Hot/Cold: Appears Intact Coordination Gross Motor Movements are Fluid and Coordinated: No Fine Motor Movements are Fluid and Coordinated: No Coordination and Movement Description: Lt sided hemiplegia with balance deficits Finger Nose Finger Test: mild dysmetria left 9 Hole Peg Test: R = 38 sec, L = 49 sec               box and blocks:   R = 44, L = 28 Motor  Motor Motor: Hemiplegia;Abnormal tone;Abnormal postural alignment and control Mobility  Bed Mobility Bed Mobility: Sit to Supine;Supine to Sit Rolling Right: Independent Rolling Left: Independent Supine to Sit: Independent Sit to Supine: Independent Transfers Sit to Stand: Supervision/Verbal cueing Stand to Sit: Supervision/Verbal cueing  Trunk/Postural Assessment  Cervical Assessment Cervical Assessment: Within Functional Limits Thoracic Assessment Thoracic Assessment: Within  Functional Limits Lumbar Assessment Lumbar Assessment: Within Functional Limits Postural Control Postural Control: Deficits on evaluation Protective Responses: Delayed  Balance Balance Balance Assessed: Yes Dynamic Sitting Balance Dynamic Sitting - Balance Support: During functional activity Dynamic Sitting - Level of Assistance: 5: Stand by assistance Sitting balance - Comments: Can balance independently when not distracted. Tends to drift backward when multitasking. Dynamic Standing  Balance Dynamic Standing - Balance Support: During functional activity Dynamic Standing - Level of Assistance: 4: Min assist Extremity/Trunk Assessment RUE Assessment RUE Assessment: Within Functional Limits LUE Assessment Passive Range of Motion (PROM) Comments: shoulder 120 flexion/abd, ER WFL, elbow extension 170, distal WNL Active Range of Motion (AROM) Comments: shoulder 1/2, distal full AROM   Erline Levine A Ulonda Klosowski 09/23/2020, 4:02 PM
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
## 2020-09-23 NOTE — Progress Notes (Signed)
Physical Therapy Discharge Summary  Patient Details  Name: Tami Brown MRN: [id number] Date of Birth: 03-02-1996  Today's Date: 09/23/2020 PT Individual Time: 0805-0900 and 2023-3435 PT Individual Time Calculation (min): 55 min and 26 min   Patient has met 7 of 9 long term goals due to improved activity tolerance, improved balance, improved postural control, increased strength, ability to compensate for deficits, improved attention, improved awareness and improved coordination.  Patient to discharge at an ambulatory level Supervision.   Patient's mother and father attended family education and is independent to provide the necessary physical and cognitive assistance at discharge.  Reasons goals not met: Pt continues to reuire stand by assistance for dynamic sitting balance activities and verbal cues while performing tansfer form bed to chair. Pt's family will be providing 24/7 supervision, however, so pt is safe for DC at this time.  Recommendation:  Patient will benefit from ongoing skilled PT services in outpatient setting to continue to advance safe functional mobility, address ongoing impairments in L sided strength and inattention, balance, ambulation, and minimize fall risk.  Equipment: RW  Reasons for discharge: treatment goals met and discharge from hospital  Patient/family agrees with progress made and goals achieved: Yes   Skilled therapeutic Interventions: 1st Session: Pt received supine in bed and agrees to therapy. No complaint of pain. Supine to sit independent. Pt performs stand pivot transfer to [facility] with close supervision and cues on sequencing. WC transport to gym for time management. Pt performs car transfer and ramp navigation with CGA and cues on positioning and safety. Following seated rest break, pt ambulates 192' with close supervision, with cues for upright gaze to improve posture and balance, decreased stride width, increased L lateral weight shift, and cues to  decrease gait speed for improved gait pattern and safety. Pt performs sit to supine independently. In supine, pt performs 3x10 bridges with L leg on mat and R leg crossed over left, with cues for optimal performance. Supine to sit independent. Pt performs 3x4 reps of sit to stand using only L leg with PT support R leg and providing balance assist, minA. Pt completes for improved NMR and strengthening in L leg. WC transport back to room. Pt left seated in WC with alarm intact and all needs within reach.  2nd Session: Pt received supine in bed. Agrees to therapy and denies pain. Bed mobility independent. PT provides pt with HEP. Ambulatory transfer to/from toilet with CGA. WC transport to gym for energy conservation. Pt completes x12 steps with R hand rail and CGA, with cues on sequencing and L step placement for safety. Pt then stands and picks up cup placed on ground L and lateral to pt, to encourage increased WB through L leg and using L arm to retrieve cup. Pt completes 2x5 reps picking up cup, requiring modA on final rep due to LOB from fatigue. WC transport back to room. Pt left supine in bed with alarm intact and all needs within reach.  PT Discharge Precautions/Restrictions Precautions Precautions: Fall Restrictions Weight Bearing Restrictions: No Vision/Perception  Perception Perception: Impaired Inattention/Neglect: Other (comment) (Decreased L sided attention) Praxis Praxis: Intact  Cognition Overall Cognitive Status: Impaired/Different from baseline Arousal/Alertness: Awake/alert Orientation Level: Oriented X4 Focused Attention: Appears intact Sustained Attention: Impaired Sustained Attention Impairment: Functional basic;Verbal basic Memory: Impaired Memory Impairment: Decreased recall of new information Awareness: Impaired Awareness Impairment: Emergent impairment Problem Solving: Impaired Problem Solving Impairment: Functional complex Executive Function:  Initiating Initiating: Impaired Initiating Impairment: Verbal basic Safety/Judgment: Impaired [employer]  Amigos Scales of Cognitive Functioning: Automatic/appropriate Sensation Sensation Light Touch: Appears Intact Coordination Gross Motor Movements are Fluid and Coordinated: No Fine Motor Movements are Fluid and Coordinated: No Coordination and Movement Description: Lt sided hemiplegia with balance deficits Motor  Motor Motor: Hemiplegia;Abnormal tone;Abnormal postural alignment and control  Mobility Bed Mobility Bed Mobility: Sit to Supine;Supine to Sit Supine to Sit: Independent Sit to Supine: Independent Transfers Transfers: Sit to Stand;Stand to Sit;Stand Pivot Transfers Sit to Stand: Supervision/Verbal cueing Stand to Sit: Supervision/Verbal cueing Stand Pivot Transfers: Supervision/Verbal cueing Stand Pivot Transfer Details: Verbal cues for technique;Verbal cues for precautions/safety;Verbal cues for sequencing Transfer (Assistive device): None Locomotion  Gait Ambulation: Yes Gait Assistance: Supervision/Verbal cueing Gait Distance (Feet): 193 Feet Assistive device: None Gait Assistance Details: Verbal cues for sequencing;Tactile cues for posture;Verbal cues for gait pattern;Verbal cues for technique Gait Gait: Yes Gait Pattern: Impaired Gait Pattern: Decreased stance time - left;Decreased hip/knee flexion - left;Decreased weight shift to left;Left foot flat;Wide base of support Gait velocity: slowed Stairs / Additional Locomotion Stairs: Yes Stairs Assistance: Contact Guard/Touching assist Number of Stairs: 12 Height of Stairs: 6 Ramp: Contact Guard/touching assist Curb: Contact Guard/Touching assist Wheelchair Mobility Wheelchair Mobility: No  Trunk/Postural Assessment  Cervical Assessment Cervical Assessment: Within Functional Limits Thoracic Assessment Thoracic Assessment: Within Functional Limits Lumbar Assessment Lumbar Assessment: Within Functional  Limits Postural Control Postural Control: Deficits on evaluation Protective Responses: Delayed  Balance Balance Balance Assessed: Yes Dynamic Sitting Balance Dynamic Sitting - Balance Support: During functional activity Dynamic Sitting - Level of Assistance: 5: Stand by assistance Sitting balance - Comments: Can balance independently when not distracted. Tends to drift backward when multitasking. Dynamic Standing Balance Dynamic Standing - Balance Support: During functional activity Dynamic Standing - Level of Assistance: 5: Stand by assistance Extremity Assessment  RLE Assessment RLE Assessment: Within Functional Limits LLE Assessment General Strength Comments: Ankle DF 3-/5, knee extension 4/5, kee flexion 4/5, hip flexion 4/5    Breck Coons, PT, DPT 09/23/2020, 8:46 AM

## 2020-09-24 DIAGNOSIS — S069X3S Unspecified intracranial injury with loss of consciousness of 1 hour to 5 hours 59 minutes, sequela: Secondary | ICD-10-CM

## 2020-09-24 DIAGNOSIS — R491 Aphonia: Secondary | ICD-10-CM

## 2020-09-24 LAB — GLUCOSE, CAPILLARY
Glucose-Capillary: 77 mg/dL (ref 70–99)
Glucose-Capillary: 84 mg/dL (ref 70–99)

## 2020-09-24 MED FILL — RESOURCE THICKEN UP: 10 days supply | Qty: 125 | Fill #0

## 2020-09-24 NOTE — Progress Notes (Signed)
Patient discharged from unit at 1150 in stable condition. Patient transported via wheelchair accompanied by RN, Tech, and parents.

## 2020-09-24 NOTE — Progress Notes (Signed)
Inpatient Rehabilitation Care Coordinator Discharge Note  The overall goal for the admission was met for:   Discharge location: Yes. D/c to home with her mother and father.  Length of Stay: Yes. 20 days.   Discharge activity level: Yes. Supervision.  Home/community participation: Yes. Limited.   Services provided included: MD, RD, PT, OT, SLP, RN, CM, TR, Pharmacy, Neuropsych and SW  Financial Services: Other: Out of State Medicaid [phone])  Choices offered to/list presented to:N/A  Follow-up services arranged: Outpatient: [facility] for Outpatient PT/OT/SLP and [facility] 3[facility] BSC, TTB, RW  Comments (or additional information): contact pt mother Verdene Lennert [phone]  Patient/Family verbalized understanding of follow-up arrangements: Yes  Individual responsible for coordination of the follow-up plan: Pt will have support to coordinate care needs.   Confirmed correct DME delivered: Rana Snare 09/24/2020    Rana Snare

## 2020-09-24 NOTE — Discharge Instructions (Signed)
Inpatient Rehab Discharge Instructions  Tami Brown Discharge date and time: No discharge date for patient encounter.   Activities/Precautions/ Functional Status: Activity: As tolerated Diet: Dysphagia #1 nectar liquids.  Osmolite 237 mL 3 times daily with meals  per PEG.  Free water 100 mL every 6 hours per tube Wound Care: Routine skin checks Functional status:  ___ No restrictions     ___ Walk up steps independently ___ 24/7 supervision/assistance   ___ Walk up steps with assistance ___ Intermittent supervision/assistance  ___ Bathe/dress independently ___ Walk with walker     _x__ Bathe/dress with assistance ___ Walk Independently    ___ Shower independently ___ Walk with assistance    ___ Shower with assistance ___ No alcohol     ___ Return to work/school ________  COMMUNITY REFERRALS UPON DISCHARGE:     Outpatient: PT     OT    ST             Agency: [facility]      Phone: [phone]              Appointment Date/Time: *Please expect follow-up within 7-10 business days to schedule your appointment. If you have not received follow-up, be sure to contact the site directly.*  Medical Equipment/Items Ordered: 3in1 bedside commode, tub tranfer bench, rolling walker                                                 Agency/Supplier: [employer] [phone] [facility])   GENERAL COMMUNITY RESOURCES FOR PATIENT/FAMILY: Hospital Follow-up at [facility] and [facility]ness [phone]) on Wednesday, February 2 at 10:30am with Georgian Co, NP. Please be sure call to reschedule if unable to make this appointment. Be sure to discuss financial assistance at appointment and orange card.  Special Instructions: No driving smoking or alcohol  Referral made to ENT Dr. Suszanne Conners in regards to evaluation for aphonia   My questions have been answered and I understand these instructions. I will adhere to these goals and the provided educational materials after my discharge from  the hospital.  Patient/Caregiver Signature _______________________________ Date __________  Clinician Signature _______________________________________ Date __________  Please bring this form and your medication list with you to all your follow-up doctor's appointments.

## 2020-09-24 NOTE — Progress Notes (Signed)
PEG intact intact patient requesting site to be clean around tubing insertion site, cleans with normal saline and area patted dry with a 4x4 dressing no dressing applied and no drainage or irritation noted to skin area
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
## 2020-09-27 ENCOUNTER — Telehealth: Payer: Self-pay

## 2020-09-27 NOTE — Telephone Encounter (Signed)
Transitional Care call--Mother Tami Brown    1. Are you/is patient experiencing any problems since coming home? No Are there any questions regarding any aspect of care? No 2. Are there any questions regarding medications administration/dosing? No Are meds being taken as prescribed? Yes Patient should review meds with caller to confirm 3. Have there been any falls? No 4. Has Home Health been to the house and/or have they contacted you? Mother will call, gave her the # If not, have you tried to contact them? Can we help you contact them? 5. Are bowels and bladder emptying properly? No BM as of yet, no pain Are there any unexpected incontinence issues? No If applicable, is patient following bowel/bladder programs? 6. Any fevers, problems with breathing, unexpected pain? No 7. Are there any skin problems or new areas of breakdown? No 8. Has the patient/family member arranged specialty MD follow up (ie cardiology/neurology/renal/surgical/etc)? Yes Can we help arrange? 9. Does the patient need any other services or support that we can help arrange?No 10. Are caregivers following through as expected in assisting the patient? Yes 11. Has the patient quit smoking, drinking alcohol, or using drugs as recommended? Yes  Appointment time 11:20 am, arrive time 11:00 am with Dr. Riley Kill on 09/29/2020 [address]

## 2020-09-28 ENCOUNTER — Encounter: Payer: Self-pay | Admitting: Occupational Therapy

## 2020-09-28 ENCOUNTER — Encounter: Payer: Self-pay | Admitting: Physical Therapy

## 2020-09-28 ENCOUNTER — Other Ambulatory Visit: Payer: Self-pay

## 2020-09-28 ENCOUNTER — Ambulatory Visit: Payer: Medicaid Other | Attending: Physician Assistant | Admitting: Physical Therapy

## 2020-09-28 ENCOUNTER — Ambulatory Visit: Payer: Medicaid Other | Admitting: Occupational Therapy

## 2020-09-28 DIAGNOSIS — R41840 Attention and concentration deficit: Secondary | ICD-10-CM

## 2020-09-28 DIAGNOSIS — R293 Abnormal posture: Secondary | ICD-10-CM | POA: Diagnosis present

## 2020-09-28 DIAGNOSIS — R278 Other lack of coordination: Secondary | ICD-10-CM | POA: Insufficient documentation

## 2020-09-28 DIAGNOSIS — G8114 Spastic hemiplegia affecting left nondominant side: Secondary | ICD-10-CM | POA: Insufficient documentation

## 2020-09-28 DIAGNOSIS — R2681 Unsteadiness on feet: Secondary | ICD-10-CM | POA: Insufficient documentation

## 2020-09-28 DIAGNOSIS — R2689 Other abnormalities of gait and mobility: Secondary | ICD-10-CM | POA: Diagnosis present

## 2020-09-28 DIAGNOSIS — M6281 Muscle weakness (generalized): Secondary | ICD-10-CM | POA: Insufficient documentation
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
## 2020-09-29 ENCOUNTER — Other Ambulatory Visit: Payer: Self-pay

## 2020-09-29 ENCOUNTER — Encounter: Payer: Self-pay | Admitting: Physical Medicine & Rehabilitation

## 2020-09-29 ENCOUNTER — Encounter: Payer: Medicaid Other | Attending: Physical Medicine & Rehabilitation | Admitting: Physical Medicine & Rehabilitation

## 2020-09-29 VITALS — BP 108/76 | HR 89 | Temp 98.0°F

## 2020-09-29 DIAGNOSIS — R491 Aphonia: Secondary | ICD-10-CM | POA: Insufficient documentation

## 2020-09-29 DIAGNOSIS — R1312 Dysphagia, oropharyngeal phase: Secondary | ICD-10-CM | POA: Insufficient documentation

## 2020-09-29 DIAGNOSIS — S069X9S Unspecified intracranial injury with loss of consciousness of unspecified duration, sequela: Secondary | ICD-10-CM | POA: Insufficient documentation
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
## 2020-09-29 NOTE — Patient Instructions (Addendum)
PLEASE FEEL FREE TO CALL OUR OFFICE WITH ANY PROBLEMS OR QUESTIONS [phone])  AMANTADINE: TAKE ONCE DAILY IN MORNING FOR ONE WEEK THEN STOP.

## 2020-09-30 ENCOUNTER — Telehealth: Payer: Self-pay

## 2020-09-30 NOTE — Telephone Encounter (Signed)
SW followed up with [facility] about referral sent for outpatient therapy in [place] per family request. Referral was not received as too many pages. SW faxed referral again to [phone]. SW updated pt mother [name] on above, and will follow-up.

## 2020-10-05 ENCOUNTER — Ambulatory Visit (INDEPENDENT_AMBULATORY_CARE_PROVIDER_SITE_OTHER): Payer: Self-pay | Admitting: Plastic Surgery

## 2020-10-05 ENCOUNTER — Ambulatory Visit: Payer: Medicaid Other

## 2020-10-05 ENCOUNTER — Ambulatory Visit: Payer: Medicaid Other | Attending: Physician Assistant | Admitting: Occupational Therapy

## 2020-10-05 ENCOUNTER — Other Ambulatory Visit: Payer: Self-pay

## 2020-10-05 ENCOUNTER — Encounter: Payer: Self-pay | Admitting: Plastic Surgery

## 2020-10-05 VITALS — BP 109/67 | HR 63

## 2020-10-05 DIAGNOSIS — G8114 Spastic hemiplegia affecting left nondominant side: Secondary | ICD-10-CM

## 2020-10-05 DIAGNOSIS — M6281 Muscle weakness (generalized): Secondary | ICD-10-CM | POA: Insufficient documentation

## 2020-10-05 DIAGNOSIS — R2681 Unsteadiness on feet: Secondary | ICD-10-CM | POA: Insufficient documentation

## 2020-10-05 DIAGNOSIS — R293 Abnormal posture: Secondary | ICD-10-CM | POA: Diagnosis present

## 2020-10-05 DIAGNOSIS — R41840 Attention and concentration deficit: Secondary | ICD-10-CM | POA: Insufficient documentation

## 2020-10-05 DIAGNOSIS — R2689 Other abnormalities of gait and mobility: Secondary | ICD-10-CM | POA: Insufficient documentation

## 2020-10-05 DIAGNOSIS — R278 Other lack of coordination: Secondary | ICD-10-CM | POA: Diagnosis present

## 2020-10-05 DIAGNOSIS — S069X9S Unspecified intracranial injury with loss of consciousness of unspecified duration, sequela: Secondary | ICD-10-CM

## 2020-10-05 DIAGNOSIS — T1490XA Injury, unspecified, initial encounter: Secondary | ICD-10-CM

## 2020-10-05 NOTE — Patient Instructions (Signed)
   Self Passive Range of Motion   In sitting, clasp hands together and bend down towards toes for a stretch for your shoulder. Repeat 10 times and do 2-3 times a day.  In sitting, hold cane with both hands and slide across legs and down to toes for a stretch for your shoulder. Repeat 10 times and 2-3 times a day.

## 2020-10-05 NOTE — Patient Instructions (Signed)
Access Code: [email or web address] URL: [email or web address] Date: 10/05/2020 Prepared by: Elmer Bales  Exercises Supine Bridge - 1 x daily - 5 x weekly - 2 sets - 10 reps Clamshell - 1 x daily - 5 x weekly - 2 sets - 10 reps Tall Kneeling Hip Hinge - 1 x daily - 5 x weekly - 2 sets - 10 reps Gastroc Stretch on Wall - 4 x daily - 7 x weekly - 1 sets - 4 reps - 1 min hold
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
## 2020-10-06 ENCOUNTER — Ambulatory Visit: Payer: Medicaid Other | Admitting: Occupational Therapy

## 2020-10-06 ENCOUNTER — Ambulatory Visit: Payer: Medicaid Other | Admitting: Physical Therapy

## 2020-10-06 ENCOUNTER — Encounter: Payer: Self-pay | Admitting: Physician Assistant

## 2020-10-06 ENCOUNTER — Ambulatory Visit: Payer: Self-pay | Attending: Physician Assistant | Admitting: Physician Assistant

## 2020-10-06 ENCOUNTER — Encounter: Payer: Self-pay | Admitting: Occupational Therapy

## 2020-10-06 ENCOUNTER — Encounter: Payer: Self-pay | Admitting: Physical Therapy

## 2020-10-06 VITALS — BP 106/72 | HR 52 | Temp 97.8°F | Resp 16 | Ht 68.0 in | Wt 141.8 lb

## 2020-10-06 DIAGNOSIS — R41840 Attention and concentration deficit: Secondary | ICD-10-CM

## 2020-10-06 DIAGNOSIS — Z09 Encounter for follow-up examination after completed treatment for conditions other than malignant neoplasm: Secondary | ICD-10-CM

## 2020-10-06 DIAGNOSIS — R2689 Other abnormalities of gait and mobility: Secondary | ICD-10-CM

## 2020-10-06 DIAGNOSIS — R000 Tachycardia, unspecified: Secondary | ICD-10-CM

## 2020-10-06 DIAGNOSIS — L709 Acne, unspecified: Secondary | ICD-10-CM

## 2020-10-06 DIAGNOSIS — G8114 Spastic hemiplegia affecting left nondominant side: Secondary | ICD-10-CM

## 2020-10-06 DIAGNOSIS — S129XXD Fracture of neck, unspecified, subsequent encounter: Secondary | ICD-10-CM

## 2020-10-06 DIAGNOSIS — R2681 Unsteadiness on feet: Secondary | ICD-10-CM

## 2020-10-06 DIAGNOSIS — S069X3S Unspecified intracranial injury with loss of consciousness of 1 hour to 5 hours 59 minutes, sequela: Secondary | ICD-10-CM

## 2020-10-06 DIAGNOSIS — R293 Abnormal posture: Secondary | ICD-10-CM

## 2020-10-06 DIAGNOSIS — M6281 Muscle weakness (generalized): Secondary | ICD-10-CM

## 2020-10-06 DIAGNOSIS — R1312 Dysphagia, oropharyngeal phase: Secondary | ICD-10-CM

## 2020-10-06 DIAGNOSIS — R739 Hyperglycemia, unspecified: Secondary | ICD-10-CM

## 2020-10-06 DIAGNOSIS — R278 Other lack of coordination: Secondary | ICD-10-CM

## 2020-10-06 MED ORDER — CLINDAMYCIN PHOS-BENZOYL PEROX 1-5 % EX GEL: Freq: Every day | CUTANEOUS | 2 refills | Status: AC

## 2020-10-06 NOTE — Patient Instructions (Signed)
Drink 80-100 ounces water daily  Practice talking.  Practice PT exercises at home
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
## 2020-10-07 LAB — CBC WITH DIFFERENTIAL/PLATELET
Basophils Absolute: 0.1 10*3/uL (ref 0.0–0.2)
Basos: 1 %
EOS (ABSOLUTE): 0.1 10*3/uL (ref 0.0–0.4)
Eos: 1 %
Hematocrit: 46.1 % (ref 34.0–46.6)
Hemoglobin: 15.7 g/dL (ref 11.1–15.9)
Immature Grans (Abs): 0 10*3/uL (ref 0.0–0.1)
Immature Granulocytes: 0 %
Lymphocytes Absolute: 2.4 10*3/uL (ref 0.7–3.1)
Lymphs: 35 %
MCH: 31.1 pg (ref 26.6–33.0)
MCHC: 34.1 g/dL (ref 31.5–35.7)
MCV: 91 fL (ref 79–97)
Monocytes Absolute: 0.3 10*3/uL (ref 0.1–0.9)
Monocytes: 5 %
Neutrophils Absolute: 3.9 10*3/uL (ref 1.4–7.0)
Neutrophils: 58 %
Platelets: 115 10*3/uL — ABNORMAL LOW (ref 150–450)
RBC: 5.05 x10E6/uL (ref 3.77–5.28)
RDW: 11.2 % — ABNORMAL LOW (ref 11.7–15.4)
WBC: 6.7 10*3/uL (ref 3.4–10.8)

## 2020-10-07 LAB — THYROID PANEL WITH TSH
Free Thyroxine Index: 2.7 (ref 1.2–4.9)
T3 Uptake Ratio: 29 % (ref 24–39)
T4, Total: 9.2 ug/dL (ref 4.5–12.0)
TSH: 0.694 u[IU]/mL (ref 0.450–4.500)

## 2020-10-07 LAB — COMPREHENSIVE METABOLIC PANEL
ALT: 9 IU/L (ref 0–32)
AST: 12 IU/L (ref 0–40)
Albumin/Globulin Ratio: 1.2 (ref 1.2–2.2)
Albumin: 4.3 g/dL (ref 3.9–5.0)
Alkaline Phosphatase: 97 IU/L (ref 44–121)
BUN/Creatinine Ratio: 10 (ref 9–23)
BUN: 8 mg/dL (ref 6–20)
Bilirubin Total: 0.3 mg/dL (ref 0.0–1.2)
CO2: 18 mmol/L — ABNORMAL LOW (ref 20–29)
Calcium: 9.7 mg/dL (ref 8.7–10.2)
Chloride: 104 mmol/L (ref 96–106)
Creatinine, Ser: 0.79 mg/dL (ref 0.57–1.00)
GFR calc Af Amer: 121 mL/min/{1.73_m2} (ref >59)
GFR calc non Af Amer: 105 mL/min/{1.73_m2} (ref >59)
Globulin, Total: 3.6 g/dL (ref 1.5–4.5)
Glucose: 76 mg/dL (ref 65–99)
Potassium: 4.5 mmol/L (ref 3.5–5.2)
Sodium: 141 mmol/L (ref 134–144)
Total Protein: 7.9 g/dL (ref 6.0–8.5)

## 2020-10-07 LAB — HEMOGLOBIN A1C
Est. average glucose Bld gHb Est-mCnc: 100 mg/dL
Hgb A1c MFr Bld: 5.1 % (ref 4.8–5.6)

## 2020-10-08 ENCOUNTER — Telehealth: Payer: Self-pay | Admitting: *Deleted

## 2020-10-08 NOTE — Telephone Encounter (Signed)
Dr. Caren Hazy is out of the office. [name]'s Mother is has been informed.

## 2020-10-08 NOTE — Telephone Encounter (Signed)
Tami Brown's mother called and reports that the earliest they can remove her tube is 11/03/20.  She is asking if there is anywhere else it can be done that they may get to her sooner.  Please advise.
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
## 2020-10-11 ENCOUNTER — Ambulatory Visit: Payer: Medicaid Other | Admitting: Physical Therapy

## 2020-10-11 ENCOUNTER — Other Ambulatory Visit: Payer: Self-pay

## 2020-10-11 ENCOUNTER — Ambulatory Visit: Payer: Medicaid Other | Admitting: Occupational Therapy

## 2020-10-11 ENCOUNTER — Encounter: Payer: Self-pay | Admitting: Occupational Therapy

## 2020-10-11 DIAGNOSIS — R41840 Attention and concentration deficit: Secondary | ICD-10-CM

## 2020-10-11 DIAGNOSIS — R2689 Other abnormalities of gait and mobility: Secondary | ICD-10-CM

## 2020-10-11 DIAGNOSIS — R2681 Unsteadiness on feet: Secondary | ICD-10-CM

## 2020-10-11 DIAGNOSIS — M6281 Muscle weakness (generalized): Secondary | ICD-10-CM

## 2020-10-11 DIAGNOSIS — G8114 Spastic hemiplegia affecting left nondominant side: Secondary | ICD-10-CM | POA: Diagnosis not present

## 2020-10-11 DIAGNOSIS — R278 Other lack of coordination: Secondary | ICD-10-CM
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
## 2020-10-11 NOTE — Telephone Encounter (Signed)
Unfortunately, no. You would be hard pressed to find someone willing to take that out who didn't place it. It's less than a month now at this point anyway. I know she's anxious! Just hold on!  ZTS

## 2020-10-11 NOTE — Patient Instructions (Signed)
Left foot stretch:  -Seated at the edge of a chair, place your left foot on a water bottle or pool noodle -Slowly roll your left foot forward, then back -When you roll your foot back, make sure to push the heel towards the ground, and hold for 5-10 seconds to stretch your toes.  Slowly push the foot forward.   -You can repeat 5-10 times, 1-2 times per day.

## 2020-10-12 ENCOUNTER — Telehealth: Payer: Self-pay

## 2020-10-12 NOTE — Telephone Encounter (Signed)
Copied from CRM [id number]. Topic: General - Call Back - No Documentation >> Oct 12, 2020  4:24 PM Randol Kern wrote: Pt's mother Marcelle Overlie called requesting to speak to Georgian Co regarding patient receiving a Covid 19 vaccine. Best contact: [phone]   Patient saw Sharon Seller on 10/06/20 and has upcoming appointment with [place]. Please advise.
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
## 2020-10-12 NOTE — Telephone Encounter (Signed)
Notified. 

## 2020-10-13 NOTE — Telephone Encounter (Signed)
Pt is requesting information on receiving covid vaccine.

## 2020-10-15 ENCOUNTER — Other Ambulatory Visit: Payer: Self-pay

## 2020-10-15 ENCOUNTER — Ambulatory Visit: Payer: Medicaid Other | Admitting: Physical Therapy

## 2020-10-15 ENCOUNTER — Encounter: Payer: Self-pay | Admitting: Physical Therapy

## 2020-10-15 ENCOUNTER — Encounter: Payer: Self-pay | Admitting: Occupational Therapy

## 2020-10-15 ENCOUNTER — Ambulatory Visit: Payer: Medicaid Other | Admitting: Occupational Therapy

## 2020-10-15 DIAGNOSIS — M6281 Muscle weakness (generalized): Secondary | ICD-10-CM

## 2020-10-15 DIAGNOSIS — G8114 Spastic hemiplegia affecting left nondominant side: Secondary | ICD-10-CM | POA: Diagnosis not present

## 2020-10-15 DIAGNOSIS — R41840 Attention and concentration deficit: Secondary | ICD-10-CM

## 2020-10-15 DIAGNOSIS — R2681 Unsteadiness on feet: Secondary | ICD-10-CM

## 2020-10-15 DIAGNOSIS — R278 Other lack of coordination: Secondary | ICD-10-CM

## 2020-10-15 DIAGNOSIS — R2689 Other abnormalities of gait and mobility: Secondary | ICD-10-CM

## 2020-10-15 NOTE — Patient Instructions (Signed)
Access Code: [email or web address] URL: [email or web address] Date: 10/15/2020 Prepared by: Sallyanne Kuster  Exercises Supine Bridge - 1 x daily - 5 x weekly - 2 sets - 10 reps Clamshell - 1 x daily - 5 x weekly - 2 sets - 10 reps Tall Kneeling Hip Hinge - 1 x daily - 5 x weekly - 2 sets - 10 reps Gastroc Stretch on Wall - 4 x daily - 7 x weekly - 1 sets - 4 reps - 1 min hold   Seated Calf Stretch with Strap - 1 x daily - 5 x weekly - 1 sets - 10 reps
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
## 2020-10-18 ENCOUNTER — Telehealth: Payer: Self-pay

## 2020-10-18 NOTE — Telephone Encounter (Signed)
--  Tami Brown's Mother called requesting patient medical records. Tami Brown will be returning to [place] for health care. They are also trying to get the "G-tube" removed ASAP.  Her mother has been advised to cancel any future appointment in the [facility] system ASAP.   Call back ph --[phone]

## 2020-10-19 NOTE — Telephone Encounter (Signed)
Dr. Riley Kill advice reinforced.

## 2020-10-19 NOTE — Telephone Encounter (Signed)
They'll need to contact [facility] about getting medical records. As I've stated before, they'll have to discuss the G-tube with General Surgery [phone]). Perhaps she can be worked in sooner.

## 2020-10-21 ENCOUNTER — Ambulatory Visit: Payer: Medicaid Other | Admitting: Physical Therapy

## 2020-10-21 ENCOUNTER — Encounter: Payer: Self-pay | Admitting: Physical Therapy

## 2020-10-21 ENCOUNTER — Ambulatory Visit: Payer: Medicaid Other | Admitting: Occupational Therapy

## 2020-10-21 NOTE — Therapy (Addendum)
[facility] [address] [place], [place], [address] Phone: [phone]   Fax:  [phone]  Patient Details  Name: Tami Brown MRN: [id number] Date of Birth: 07/30/1996 Referring Provider:  Dr. Alger Simons  Encounter Date: 10/21/2020  PHYSICAL THERAPY DISCHARGE SUMMARY  Visits from Start of Care: 5  Current functional level related to goals / functional outcomes: See evaluation; goal had not been fully assessed.  At time of d/c, pt is ambulating 115 ft with min assist with   Step-through pattern;Decreased arm swing - right;Decreased arm swing - left;Decreased stance time - left;Decreased dorsiflexion - left;Left genu recurvatum;Ataxic;Decreased trunk rotation.  PT has attempted several trials for AFO options; however, no appropriate option found yet and PT was planning orthotic consult. Supervision for transfers Decreased strength lower extremities and trunk, decreased balance Remaining deficits: See above   Education / Equipment: Educated in initial HEP, with family and pt return demo understanding.  Plan: Patient agrees to discharge.  Patient goals were not met. Patient is being discharged due to the patient's request.  ?????Family requests discharge at this time, as pt/family are returning to [place].       Sujata Maines W. 10/21/2020, 3:07 PM  [facility] [place] [address] [place], [place], [address] Phone: [phone]   Fax:  [phone]

## 2020-10-22 ENCOUNTER — Ambulatory Visit: Payer: Medicaid Other | Admitting: Occupational Therapy

## 2020-10-22 ENCOUNTER — Ambulatory Visit: Payer: Medicaid Other | Admitting: Physical Therapy

## 2020-10-22 ENCOUNTER — Encounter: Payer: Self-pay | Admitting: Occupational Therapy

## 2020-10-22 NOTE — Therapy (Signed)
[facility] [address] [place], [place], [address] Phone: [phone]   Fax:  [phone]  Patient Details  Name: Tami Brown MRN: [id number] Date of Birth: [birth year] Referring Provider:  No ref. provider found  Encounter Date: 10/22/2020   OCCUPATIONAL THERAPY DISCHARGE SUMMARY  Visits from Start of Care: 5  Current functional level related to goals / functional outcomes: Pt has made improvement with participation in ADLs and overall LUE functional use and coordination. Pt as not met all goals but has progressed. Pt is discharging as she is returning back home to where her parents reside.   Remaining deficits: Continues to require assistance and supervision for all ADLs and IADLs. Significant LUE hemiparesis, tone and deficits with need for neuromuscular reeducation.   Education / Equipment: HEPs for [employer] for LUE  Plan: Patient agrees to discharge.  Patient goals were not met. Patient is being discharged due to not returning since the last visit.  ?????         [place] MOT, OTR/L  10/22/2020, 2:00 PM  [facility] [address] [place], [place], [address] Phone: [phone]   Fax:  [phone]

## 2020-10-27 ENCOUNTER — Ambulatory Visit: Payer: Medicaid Other | Admitting: Occupational Therapy

## 2020-10-27 ENCOUNTER — Ambulatory Visit: Payer: Medicaid Other | Admitting: Physical Therapy

## 2020-10-29 ENCOUNTER — Ambulatory Visit: Payer: Medicaid Other | Admitting: Occupational Therapy

## 2020-10-29 ENCOUNTER — Ambulatory Visit: Payer: Medicaid Other | Admitting: Physical Therapy

## 2020-10-29 ENCOUNTER — Ambulatory Visit: Payer: Medicaid Other

## 2020-11-01 ENCOUNTER — Ambulatory Visit: Payer: Medicaid Other

## 2020-11-01 ENCOUNTER — Ambulatory Visit: Payer: Medicaid Other | Admitting: Occupational Therapy

## 2020-11-01 ENCOUNTER — Ambulatory Visit: Payer: Medicaid Other | Admitting: Physical Therapy

## 2020-11-03 ENCOUNTER — Ambulatory Visit: Payer: PRIVATE HEALTH INSURANCE | Admitting: Physical Therapy

## 2020-11-03 ENCOUNTER — Encounter: Payer: PRIVATE HEALTH INSURANCE | Admitting: Occupational Therapy

## 2020-11-08 ENCOUNTER — Encounter: Payer: PRIVATE HEALTH INSURANCE | Admitting: Occupational Therapy

## 2020-11-08 ENCOUNTER — Ambulatory Visit: Payer: PRIVATE HEALTH INSURANCE | Admitting: Physical Therapy

## 2020-11-10 ENCOUNTER — Ambulatory Visit: Payer: PRIVATE HEALTH INSURANCE | Admitting: Physical Therapy

## 2020-11-10 ENCOUNTER — Encounter: Payer: PRIVATE HEALTH INSURANCE | Admitting: Occupational Therapy

## 2020-11-15 ENCOUNTER — Ambulatory Visit: Payer: PRIVATE HEALTH INSURANCE | Admitting: Physical Therapy

## 2020-11-15 ENCOUNTER — Encounter: Payer: PRIVATE HEALTH INSURANCE | Admitting: Occupational Therapy

## 2020-11-17 ENCOUNTER — Ambulatory Visit: Payer: Self-pay | Admitting: Family Medicine

## 2020-11-17 ENCOUNTER — Ambulatory Visit: Payer: PRIVATE HEALTH INSURANCE | Admitting: Physical Therapy

## 2020-11-17 ENCOUNTER — Encounter: Payer: PRIVATE HEALTH INSURANCE | Admitting: Occupational Therapy

## 2020-11-22 ENCOUNTER — Encounter: Payer: PRIVATE HEALTH INSURANCE | Admitting: Occupational Therapy

## 2020-11-24 ENCOUNTER — Ambulatory Visit: Payer: PRIVATE HEALTH INSURANCE | Admitting: Physical Therapy

## 2020-11-24 ENCOUNTER — Encounter: Payer: PRIVATE HEALTH INSURANCE | Admitting: Occupational Therapy

## 2020-12-01 ENCOUNTER — Encounter: Payer: Self-pay | Attending: Physical Medicine & Rehabilitation | Admitting: Physical Medicine & Rehabilitation

## 2020-12-01 DIAGNOSIS — R1312 Dysphagia, oropharyngeal phase: Secondary | ICD-10-CM | POA: Insufficient documentation

## 2020-12-01 DIAGNOSIS — R491 Aphonia: Secondary | ICD-10-CM | POA: Insufficient documentation

## 2020-12-01 DIAGNOSIS — S069X9S Unspecified intracranial injury with loss of consciousness of unspecified duration, sequela: Secondary | ICD-10-CM | POA: Insufficient documentation
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
